# Patient Record
Sex: Female | Born: 1955 | Race: White | Hispanic: No | Marital: Married | Smoking: Former smoker
Health system: Southern US, Community
[De-identification: ages and names within clinical notes are randomized; demographics above are authoritative.]

## PROBLEM LIST (undated history)

## (undated) DIAGNOSIS — K219 Gastro-esophageal reflux disease without esophagitis: Secondary | ICD-10-CM

## (undated) DIAGNOSIS — G4730 Sleep apnea, unspecified: Secondary | ICD-10-CM

## (undated) DIAGNOSIS — K649 Unspecified hemorrhoids: Secondary | ICD-10-CM

## (undated) DIAGNOSIS — E119 Type 2 diabetes mellitus without complications: Secondary | ICD-10-CM

## (undated) DIAGNOSIS — C786 Secondary malignant neoplasm of retroperitoneum and peritoneum: Secondary | ICD-10-CM

## (undated) DIAGNOSIS — F329 Major depressive disorder, single episode, unspecified: Secondary | ICD-10-CM

## (undated) DIAGNOSIS — Z9889 Other specified postprocedural states: Secondary | ICD-10-CM

## (undated) DIAGNOSIS — C801 Malignant (primary) neoplasm, unspecified: Secondary | ICD-10-CM

## (undated) DIAGNOSIS — R1310 Dysphagia, unspecified: Secondary | ICD-10-CM

## (undated) DIAGNOSIS — R42 Dizziness and giddiness: Secondary | ICD-10-CM

## (undated) DIAGNOSIS — G709 Myoneural disorder, unspecified: Secondary | ICD-10-CM

## (undated) DIAGNOSIS — R350 Frequency of micturition: Secondary | ICD-10-CM

## (undated) DIAGNOSIS — I10 Essential (primary) hypertension: Secondary | ICD-10-CM

## (undated) DIAGNOSIS — R198 Other specified symptoms and signs involving the digestive system and abdomen: Secondary | ICD-10-CM

## (undated) DIAGNOSIS — F32A Depression, unspecified: Secondary | ICD-10-CM

## (undated) DIAGNOSIS — R112 Nausea with vomiting, unspecified: Secondary | ICD-10-CM

## (undated) DIAGNOSIS — K626 Ulcer of anus and rectum: Secondary | ICD-10-CM

## (undated) DIAGNOSIS — IMO0001 Reserved for inherently not codable concepts without codable children: Secondary | ICD-10-CM

## (undated) DIAGNOSIS — D649 Anemia, unspecified: Secondary | ICD-10-CM

## (undated) HISTORY — PX: FRACTURE SURGERY: SHX138

## (undated) HISTORY — PX: ABDOMINAL HYSTERECTOMY: SHX81

## (undated) HISTORY — PX: HIP FRACTURE SURGERY: SHX118

---

## 1991-08-13 HISTORY — PX: BACK SURGERY: SHX140

## 1994-08-12 HISTORY — PX: OTHER SURGICAL HISTORY: SHX169

## 2000-07-28 ENCOUNTER — Other Ambulatory Visit: Admission: RE | Admit: 2000-07-28 | Discharge: 2000-07-28 | Payer: Self-pay | Admitting: Obstetrics and Gynecology

## 2000-08-19 ENCOUNTER — Inpatient Hospital Stay (HOSPITAL_COMMUNITY): Admission: AD | Admit: 2000-08-19 | Discharge: 2000-08-19 | Payer: Self-pay

## 2000-09-16 ENCOUNTER — Encounter (INDEPENDENT_AMBULATORY_CARE_PROVIDER_SITE_OTHER): Payer: Self-pay | Admitting: Specialist

## 2000-09-16 ENCOUNTER — Inpatient Hospital Stay (HOSPITAL_COMMUNITY): Admission: RE | Admit: 2000-09-16 | Discharge: 2000-09-18 | Payer: Self-pay | Admitting: Obstetrics and Gynecology

## 2001-10-14 ENCOUNTER — Other Ambulatory Visit: Admission: RE | Admit: 2001-10-14 | Discharge: 2001-10-14 | Payer: Self-pay | Admitting: Obstetrics and Gynecology

## 2002-12-17 ENCOUNTER — Other Ambulatory Visit: Admission: RE | Admit: 2002-12-17 | Discharge: 2002-12-17 | Payer: Self-pay | Admitting: Obstetrics and Gynecology

## 2004-01-13 ENCOUNTER — Other Ambulatory Visit: Admission: RE | Admit: 2004-01-13 | Discharge: 2004-01-13 | Payer: Self-pay | Admitting: Obstetrics and Gynecology

## 2005-08-30 ENCOUNTER — Other Ambulatory Visit: Payer: Self-pay

## 2005-09-04 ENCOUNTER — Ambulatory Visit: Payer: Self-pay | Admitting: Otolaryngology

## 2009-09-11 ENCOUNTER — Encounter (HOSPITAL_BASED_OUTPATIENT_CLINIC_OR_DEPARTMENT_OTHER): Admission: RE | Admit: 2009-09-11 | Discharge: 2009-10-17 | Payer: Self-pay | Admitting: Internal Medicine

## 2009-12-04 ENCOUNTER — Emergency Department: Payer: Self-pay | Admitting: Emergency Medicine

## 2010-12-28 NOTE — Op Note (Signed)
[facility]  Patient:    JENCY, SCHNIEDERS                       MRN: [id number] Proc. Date: 09/16/00 Adm. Date:  [id number] Attending:  Marcelle Overlie                           Operative Report  PREOPERATIVE DIAGNOSES:       1. Menometrorrhagia.                               2. Uterine fibroids.                               3. Pelvic pain.  POSTOPERATIVE DIAGNOSES:      1. Menometrorrhagia.                               2. Uterine fibroids.                               3. Pelvic pain.  PROCEDURE:                    Attempted vaginal hysterectomy with conversion to exploratory laparotomy and total abdominal hysterectomy with lysis of adhesions.  SURGEON:                      Marcelle Overlie, M.D.  ASSISTANT:                    Caralyn Guile. Arlyce Dice, M.D.  ANESTHESIA:                   General.  ESTIMATED BLOOD LOSS:         250.  DESCRIPTION OF PROCEDURE:     The patient was taken to the operating room, where she was intubated.  She was then placed in the low lithotomy position and the vagina and vulva were prepped and draped in the usual sterile fashion, A speculum was inserted in the vagina.  The cervix was identified and grasped with a tenaculum.  The cervix was then infiltrated paracervically.  A circumferential incision was made using electrocautery.  Overlying vaginal epithelium was then pushed away anteriorly, posteriorly and laterally.  The patient had an extremely deep posterior cul-de-sac, so attention was then turned to the cardinal complex, which was clamped on the left side using a curved Heaney clamp.  The pedicles were then cut and suture ligated using 0 Vicryl.  This was done on the right side in a similar fashion.  We then continued to push the overlying vaginal epithelial tissue away anteriorly, posteriorly and laterally.  The cul-de-sac was then identified posteriorly and entered sharply.  There were no adhesions in the  posterior cul-de-sac. Attention was then turned anteriorly.  Using blunt and sharp dissection, the anterior cul-de-sac was entered.  This did take a great deal of blunt and sharp dissection.  We then clamped the uterosacrals on both sides.  The pedicles were cut and suture ligated using 0 Vicryl suture.  The uterine arteries were then clamped subsequently using curved Heaney clamps on each side.  The pedicles were cut and suture ligated  using 0 Vicryl suture.  As we continued to clamp along the broad ligament, the uterus did not descend well. It felt like there was some resistance to the uterus, and even attempts to retroflex the uterus using tenaculum were unsuccessful.  Because of this, it was felt that there might be some adhesions to the uterine fundus.  A conversion to exploratory laparotomy was decided upon.  The patient was then placed supine on the operating room table.  The abdomen was prepped and draped in the usual sterile fashion.  A Foley catheter was already in the bladder, which was placed at the beginning of the case.  Using a scalpel, a low transverse incision was made and carried down to the fascia using electrocautery.  The fascia was scored in the midline and extended laterally using Mayo scissors.  The midline was then separated.  The peritoneum was entered sharply.  An OConner-OSullivan retractor was placed into the abdominal cavity.  Attention then turned to the pelvis and there were thick omental adhesions to the uterine fundus anteriorly.  These were then freed using sharp and blunt dissection.  At this point, we then proceeded with completing the hysterectomy.  The round ligament was identified on the right side.  It was transected using 0 Vicryl suture and then transected using electrocautery.  The remainder of the bladder flap was then created using sharp dissection.  An avascular window was found beneath the triple pedicle and ______.  A curved Heaney clamp was  placed across this, the pedicles cut and the triple pedicle was secured using a suture ligature using 0 Vicryl suture and a free tie of 0 Vicryl.  On the left side of the pelvis, the round ligament was transected using 0 Vicryl in a similar fashion.  A bladder flap was further developed.  An avascular window was created beneath the triple pedicle and a curved Heaney clamp was placed across the triple pedicle.  The pedicle was cut and suture ligated using 0 Vicryl suture as well as a free tie of 0 Vicryl.  We then realized that the remainder of the hysterectomy had been completed vaginally.  The uterus was removed from the surgical field and sent to pathology.  The ovaries were normal in appearance.  The fallopian tubes were also normal in appearance.  Attention was then turned to the vaginal cuff, which was deep in the pelvis.  It was then grasped using Kocher clamps and Allis clamps and the remainder of the cuff was closed in a running stitch using 0 Vicryl suture.  Irrigation was performed.  All pedicles were inspected and noted to be hemostatic.  The patient tolerated the procedure extremely well.  The retractor was removed from the abdominal cavity.  The peritoneal incision was closed using 0 Vicryl in a continuous running stitch.  The rectus muscles were reapproximated using the same stitch.  The fascia was then closed using 0 Vicryl in a continuous running stitch starting at each corner and meeting in the midline.  The skin was closed with staples.  All sponge, lap and instrument counts were correct x 2.  The patient tolerated the procedure well and went to the recovery room in stable condition.  COMPLICATIONS:                None.  DRAINS:                       Foley.  PATHOLOGY:  Uterus and cervix. DD:  09/16/00 TD:  09/17/00 Job: [id number]

## 2010-12-28 NOTE — Discharge Summary (Signed)
[facility]  Patient:    Beth Kaufman, Beth Kaufman                       MRN: [id number] Adm. Date:  [id number] Disc. Date: [id number] Attending:  Marcelle Overlie                           Discharge Summary  ADMISSION DIAGNOSES:          1. Menometrorrhagia.                               2. Symptomatic fibroids.  DISCHARGE DIAGNOSES:          1. Menometrorrhagia.                               2. Symptomatic fibroids.  PROCEDURES IN HOSPITAL:       1. Total abdominal hysterectomy.                               2. Lysis of adhesions.  HOSPITAL COURSE:              The patient is a 55 year old, gravida 0, with a long standing history of menometrorrhagia. She has been on several different OCs and she received Lupron Depot on August 19, 2000. The patient was then taken to the operating room. A vaginal hysterectomy was attempted; however, we were unable to perform the vaginal hysterectomy secondary to omental adhesions to the fundus. The patient subsequently had a conversion to an exploratory laparotomy and had a TAH and lysis of adhesions without incident. The patient did very well postoperatively. Her hematocrit postoperative day one was 31.1, and by postoperative day #2 she was ambulating, tolerating regular diet, voiding without difficulty, and remained afebrile with stable vital signs. She was discharged home on postoperative day #2.  DISCHARGE MEDICATIONS:        She was given a prescription for Tylenol #3 as well as Tussionex to take p.r.n. cough.  DISCHARGE FOLLOWUP:           She will follow up in the office on Monday, February 11 for staple removal and she was advised to call if she has any nausea, vomiting, abdominal pain, temperature greater than 100.5, or redness or drainage from the incision site. She was advised no driving for two to three weeks.  DISPOSITION:                  She was discharged home in good condition. DD:  10/19/00 TD:   10/19/00 Job: [id number]

## 2012-06-15 ENCOUNTER — Encounter (HOSPITAL_BASED_OUTPATIENT_CLINIC_OR_DEPARTMENT_OTHER): Payer: Self-pay | Attending: General Surgery

## 2012-06-15 DIAGNOSIS — E65 Localized adiposity: Secondary | ICD-10-CM | POA: Insufficient documentation

## 2012-06-15 DIAGNOSIS — Z79899 Other long term (current) drug therapy: Secondary | ICD-10-CM | POA: Insufficient documentation

## 2012-06-15 DIAGNOSIS — L03319 Cellulitis of trunk, unspecified: Secondary | ICD-10-CM | POA: Insufficient documentation

## 2012-06-15 DIAGNOSIS — L02219 Cutaneous abscess of trunk, unspecified: Secondary | ICD-10-CM | POA: Insufficient documentation

## 2012-06-15 DIAGNOSIS — I10 Essential (primary) hypertension: Secondary | ICD-10-CM | POA: Insufficient documentation

## 2012-06-15 LAB — GLUCOSE, CAPILLARY: Glucose-Capillary: 124 mg/dL — ABNORMAL HIGH (ref 70–99)

## 2012-06-15 NOTE — Progress Notes (Signed)
[facility]  NAME:  Beth Kaufman, Beth Kaufman NO.:  [id number]  MEDICAL RECORD NO.:  [id number]      DATE OF BIRTH:  [birth year]  PHYSICIAN:  Ardath Sax, M.D.           VISIT DATE:                                  OFFICE VISIT   This is a 56 year old morbidly obese female who comes to [place] because of bouts of these little punctate, what look like, Staph infections on her buttocks and on her hips.  When I am looking at them now, they are about 3 or 4 mm in diameter.  There is only about 3 of them and it looks like they drained and they have been treated with antibiotics by her family doctor.  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  I also gave her some advice that it might be wise to see a plastic surgeon to have a panniculectomy as I do not see how this ever going to get better as long as she has got this huge panniculus hanging down.  She also has diabetes and hypertension.  She is afebrile at the time of her examination.  She has a blood pressure of 154/89, respirations 16, pulse 101.  She weighs a little over 250 pounds.  Her medicines include lisinopril for hypertension.  She also is on glipizide for her diabetes and she states she also had back surgery for ruptured disk.  She had a fractured ankle that was surgically repaired.  She also had a hysterectomy.  She was treated today with the Silvercel and I told her to use the powder and to call and come back if she ever needed [place] and to follow up with the plastic surgeon.  So her diagnosis is obesity, type 2 diabetes, hypertension, cellulitis of the area in between the layers of skin from her large abdominal panniculus and now these little areas that look like superficial staph infections  that have been treated successfully with antibiotics and is now healed over.     Ardath Sax, M.D.     PP/MEDQ  D:  06/15/2012  T:  06/15/2012  Job:  [id number]

## 2012-07-13 ENCOUNTER — Encounter (HOSPITAL_BASED_OUTPATIENT_CLINIC_OR_DEPARTMENT_OTHER): Payer: Self-pay

## 2013-02-23 ENCOUNTER — Ambulatory Visit: Payer: Self-pay | Admitting: Internal Medicine

## 2013-12-01 ENCOUNTER — Ambulatory Visit: Payer: Self-pay | Admitting: Podiatry

## 2013-12-29 ENCOUNTER — Ambulatory Visit: Payer: Self-pay | Admitting: Podiatry

## 2014-01-31 ENCOUNTER — Ambulatory Visit: Payer: Self-pay | Admitting: Podiatry

## 2014-02-21 ENCOUNTER — Ambulatory Visit: Payer: Self-pay | Admitting: Podiatry

## 2014-04-07 ENCOUNTER — Emergency Department: Payer: Self-pay | Admitting: Emergency Medicine

## 2014-08-22 ENCOUNTER — Ambulatory Visit: Payer: Self-pay | Admitting: Podiatry

## 2014-08-24 ENCOUNTER — Ambulatory Visit (INDEPENDENT_AMBULATORY_CARE_PROVIDER_SITE_OTHER): Payer: BLUE CROSS/BLUE SHIELD | Admitting: Podiatry

## 2014-08-24 ENCOUNTER — Ambulatory Visit (INDEPENDENT_AMBULATORY_CARE_PROVIDER_SITE_OTHER): Payer: BLUE CROSS/BLUE SHIELD

## 2014-08-24 ENCOUNTER — Encounter: Payer: Self-pay | Admitting: Podiatry

## 2014-08-24 VITALS — BP 128/69 | HR 78 | Resp 16 | Ht 64.0 in | Wt 229.0 lb

## 2014-08-24 DIAGNOSIS — E119 Type 2 diabetes mellitus without complications: Secondary | ICD-10-CM

## 2014-08-24 DIAGNOSIS — E1142 Type 2 diabetes mellitus with diabetic polyneuropathy: Secondary | ICD-10-CM

## 2014-08-24 DIAGNOSIS — M19079 Primary osteoarthritis, unspecified ankle and foot: Secondary | ICD-10-CM

## 2014-08-24 DIAGNOSIS — Q6650 Congenital pes planus, unspecified foot: Secondary | ICD-10-CM

## 2014-08-24 NOTE — Progress Notes (Signed)
   Subjective:    Patient ID: Beth Kaufman, female    DOB: [birth year], 59 y.o.   MRN: [id number]  HPI Comments: i am flat footed. Both 4th toes on both feet curl in. They do hurt. They have been hurting for 2 yrs. They have remained the same. i swell in both feet a lot. My toes are numb. My feet bother me when im able to walk and stand. i have went to the shoe market and got new shoes. My feet burn.   Foot Pain Associated symptoms include joint swelling.      Review of Systems  Musculoskeletal: Positive for joint swelling.  All other systems reviewed and are negative.      Objective:   Physical Exam: I have reviewed her past medical history medications allergy surgery social history review of systems. Pulses are palpable PT bilateral DP barely palpable bilateral. Capillary fill time to digits one through 5 of the bilateral foot was noted to be immediate. Neurologic sensorium is decreased per Semmes-Weinstein monofilament bilateral toes and forefoot. Deep tendon reflexes are intact bilateral muscle strength was 5 over 5 dorsiflexion plantar flexors and inverters and everters all into the musculature is intact. Orthopedic evaluation does demonstrate pes planus hammertoe deformity mildly to reform his bilateral. She also has adductovarus rotated hammertoe deformities with pain on palpation third interdigital space bilateral. Mild Mulder's click is noted. Cutaneous evaluation of a straight supple well-hydrated cutis no erythema edema cellulitis drainage or odor.        Assessment & Plan:  Assessment: Diabetes mellitus with diabetic peripheral neuropathy as well as possible neuropathy associated with spinal stenosis. Pes planus bilateral.  Plan: She will follow up with neurosurgery. She was also scanned for a set of diabetic shoes.

## 2014-09-28 ENCOUNTER — Ambulatory Visit: Payer: No Typology Code available for payment source | Admitting: Podiatry

## 2014-09-28 ENCOUNTER — Encounter (HOSPITAL_BASED_OUTPATIENT_CLINIC_OR_DEPARTMENT_OTHER): Payer: Self-pay | Attending: Surgery

## 2014-10-03 ENCOUNTER — Ambulatory Visit: Payer: No Typology Code available for payment source | Admitting: Podiatry

## 2014-10-10 ENCOUNTER — Encounter: Payer: Self-pay | Admitting: Podiatry

## 2014-10-10 ENCOUNTER — Ambulatory Visit (INDEPENDENT_AMBULATORY_CARE_PROVIDER_SITE_OTHER): Payer: No Typology Code available for payment source | Admitting: Podiatry

## 2014-10-10 DIAGNOSIS — E119 Type 2 diabetes mellitus without complications: Secondary | ICD-10-CM

## 2014-10-10 DIAGNOSIS — Q6650 Congenital pes planus, unspecified foot: Secondary | ICD-10-CM

## 2014-10-10 DIAGNOSIS — M19079 Primary osteoarthritis, unspecified ankle and foot: Secondary | ICD-10-CM

## 2014-10-10 DIAGNOSIS — E1142 Type 2 diabetes mellitus with diabetic polyneuropathy: Secondary | ICD-10-CM

## 2014-10-10 NOTE — Progress Notes (Signed)
Dispensed diabetic shoes and 3 pairs of insoles. Instructions were reviewed and a copy was given to the patient. Patient to reappointment for regularly scheduled diabetic foot care visits or if experiences any trouble with the diabetic shoes.

## 2014-10-10 NOTE — Patient Instructions (Signed)

## 2014-10-17 ENCOUNTER — Encounter (HOSPITAL_BASED_OUTPATIENT_CLINIC_OR_DEPARTMENT_OTHER): Payer: No Typology Code available for payment source | Attending: Plastic Surgery

## 2014-10-17 DIAGNOSIS — L89312 Pressure ulcer of right buttock, stage 2: Secondary | ICD-10-CM | POA: Insufficient documentation

## 2014-10-17 DIAGNOSIS — Z8614 Personal history of Methicillin resistant Staphylococcus aureus infection: Secondary | ICD-10-CM | POA: Insufficient documentation

## 2014-10-17 DIAGNOSIS — E1140 Type 2 diabetes mellitus with diabetic neuropathy, unspecified: Secondary | ICD-10-CM | POA: Diagnosis present

## 2014-10-17 LAB — GLUCOSE, CAPILLARY: Glucose-Capillary: 150 mg/dL — ABNORMAL HIGH (ref 70–99)

## 2014-10-31 DIAGNOSIS — L89312 Pressure ulcer of right buttock, stage 2: Secondary | ICD-10-CM | POA: Diagnosis not present

## 2014-11-02 ENCOUNTER — Ambulatory Visit
Admit: 2014-11-02 | Disposition: A | Discharge status: Home / Self Care | Payer: Self-pay | Attending: Internal Medicine | Admitting: Internal Medicine

## 2014-11-11 ENCOUNTER — Ambulatory Visit
Admit: 2014-11-11 | Disposition: A | Discharge status: Home / Self Care | Payer: Self-pay | Attending: Internal Medicine | Admitting: Internal Medicine

## 2014-11-14 ENCOUNTER — Encounter (HOSPITAL_BASED_OUTPATIENT_CLINIC_OR_DEPARTMENT_OTHER): Payer: No Typology Code available for payment source | Attending: Plastic Surgery

## 2014-11-14 DIAGNOSIS — G629 Polyneuropathy, unspecified: Secondary | ICD-10-CM | POA: Insufficient documentation

## 2014-11-14 DIAGNOSIS — L89312 Pressure ulcer of right buttock, stage 2: Secondary | ICD-10-CM | POA: Insufficient documentation

## 2014-11-14 DIAGNOSIS — M1990 Unspecified osteoarthritis, unspecified site: Secondary | ICD-10-CM | POA: Insufficient documentation

## 2014-11-14 DIAGNOSIS — E118 Type 2 diabetes mellitus with unspecified complications: Secondary | ICD-10-CM | POA: Insufficient documentation

## 2014-11-14 DIAGNOSIS — I10 Essential (primary) hypertension: Secondary | ICD-10-CM | POA: Insufficient documentation

## 2014-12-12 ENCOUNTER — Encounter (HOSPITAL_BASED_OUTPATIENT_CLINIC_OR_DEPARTMENT_OTHER): Payer: No Typology Code available for payment source

## 2014-12-12 ENCOUNTER — Other Ambulatory Visit: Payer: Self-pay | Admitting: Neurosurgery

## 2014-12-23 ENCOUNTER — Encounter (HOSPITAL_COMMUNITY): Payer: Self-pay

## 2014-12-23 ENCOUNTER — Encounter (HOSPITAL_COMMUNITY)
Admission: RE | Admit: 2014-12-23 | Discharge: 2014-12-23 | Disposition: A | Discharge status: Home / Self Care | Payer: Self-pay | Source: Ambulatory Visit | Attending: Neurosurgery | Admitting: Neurosurgery

## 2014-12-23 HISTORY — DX: Ulcer of anus and rectum: K62.6

## 2014-12-23 HISTORY — DX: Anemia, unspecified: D64.9

## 2014-12-23 HISTORY — DX: Other specified postprocedural states: Z98.890

## 2014-12-23 HISTORY — DX: Dysphagia, unspecified: R13.10

## 2014-12-23 HISTORY — DX: Dizziness and giddiness: R42

## 2014-12-23 HISTORY — DX: Major depressive disorder, single episode, unspecified: F32.9

## 2014-12-23 HISTORY — DX: Myoneural disorder, unspecified: G70.9

## 2014-12-23 HISTORY — DX: Essential (primary) hypertension: I10

## 2014-12-23 HISTORY — DX: Frequency of micturition: R35.0

## 2014-12-23 HISTORY — DX: Unspecified hemorrhoids: K64.9

## 2014-12-23 HISTORY — DX: Type 2 diabetes mellitus without complications: E11.9

## 2014-12-23 HISTORY — DX: Depression, unspecified: F32.A

## 2014-12-23 HISTORY — DX: Other specified postprocedural states: R11.2

## 2014-12-23 HISTORY — DX: Gastro-esophageal reflux disease without esophagitis: K21.9

## 2014-12-23 HISTORY — DX: Sleep apnea, unspecified: G47.30

## 2014-12-23 HISTORY — DX: Reserved for inherently not codable concepts without codable children: IMO0001

## 2014-12-23 HISTORY — DX: Other specified symptoms and signs involving the digestive system and abdomen: R19.8

## 2014-12-23 LAB — CBC
HCT: 33.3 % — ABNORMAL LOW (ref 36.0–46.0)
Hemoglobin: 10 g/dL — ABNORMAL LOW (ref 12.0–15.0)
MCH: 20.3 pg — AB (ref 26.0–34.0)
MCHC: 30 g/dL (ref 30.0–36.0)
MCV: 67.5 fL — ABNORMAL LOW (ref 78.0–100.0)
PLATELETS: 321 10*3/uL (ref 150–400)
RBC: 4.93 MIL/uL (ref 3.87–5.11)
RDW: 16.9 % — ABNORMAL HIGH (ref 11.5–15.5)
WBC: 6.5 10*3/uL (ref 4.0–10.5)

## 2014-12-23 LAB — BASIC METABOLIC PANEL
Anion gap: 12 (ref 5–15)
BUN: 16 mg/dL (ref 6–20)
CHLORIDE: 97 mmol/L — AB (ref 101–111)
CO2: 27 mmol/L (ref 22–32)
Calcium: 10.1 mg/dL (ref 8.9–10.3)
Creatinine, Ser: 1.35 mg/dL — ABNORMAL HIGH (ref 0.44–1.00)
GFR calc Af Amer: 49 mL/min — ABNORMAL LOW (ref >60)
GFR, EST NON AFRICAN AMERICAN: 42 mL/min — AB (ref >60)
GLUCOSE: 160 mg/dL — AB (ref 65–99)
Potassium: 2.7 mmol/L — CL (ref 3.5–5.1)
SODIUM: 136 mmol/L (ref 135–145)

## 2014-12-23 LAB — SURGICAL PCR SCREEN
MRSA, PCR: NEGATIVE
STAPHYLOCOCCUS AUREUS: NEGATIVE

## 2014-12-23 NOTE — Progress Notes (Signed)
Critical lab Potassium 2.7 called to [facility] @ dr. Arnoldo Morale office.

## 2014-12-23 NOTE — Pre-Procedure Instructions (Signed)
AALEAH HIRSCH  12/23/2014   Your procedure is scheduled on:  01-02-2015    Monday   Report to [facility] Admitting at 5:30 AM.   Call this number if you have problems the morning of surgery: [phone]   Remember:   Do not eat food or drink liquids after midnight.    Take these medicines the morning of surgery with A SIP OF WATER: Sertraline(Zoloft)               Stop aspirin,BC Headache powders,ibuprofen,motrin,advil.aleve,naproxen and any any herbal supplements 7 days prior to surgery     Do not wear jewelry, make-up or nail polish.  Do not wear lotions, powders, or perfumes. .  Do not shave legs or underarms for 48 hours prior to surgery    Do not bring valuables to the hospital.  [facility] is not responsible for any belongings or valuables.               Contacts, dentures or bridgework may not be worn into surgery.   Leave suitcase in the car. After surgery it may be brought to your room .  For patients admitted to the hospital, discharge time is determined by your  treatment team.               Patients discharged the day of surgery will not be allowed to drive home.      Special Instructions: See attached sheet for instructions on CHG shower/bath     Please read over the following fact sheets that you were given: Pain Booklet and Surgical Site Infection Prevention

## 2014-12-26 NOTE — Progress Notes (Signed)
Anesthesia Chart Review:  Pt is 59 year old female scheduled for L2-3, L3-4 lumbar laminectomy/decompression microdiscectomy on 01/02/2015 with Dr. Arnoldo Morale.   PCP is Dr. Emily Filbert in [place], see care everywhere.   PMH includes: HTN, OSA, DM, anemia, GERD. Former smoker. BMI 34.   Preoperative labs reviewed.  H/H 10/33.3. This is consistent with prior results in care everywhere, being treated for iron deficiency anemia by PCP.   K 2.7. Pt went same day as PAT to PCP's office for follow up of hypokalemia. See care everywhere. BMP repeated, K 3.3, which is stable when compared to labs on 10/13/14. Pt's hctz 25mg  held for 2 days then to restart at 12.5mg  daily, pt started on KCL 11meq daily, f/u recommended for the following week.   EKG: NSR. Moderate voltage criteria for LVH, may be normal variant. Since last tracing 08/30/2005 T wave abnormality is new (appears nonspecific).   If no changes, I anticipate pt can proceed with surgery as scheduled.   Willeen Cass, FNP-BC [facility]/Anesthesiology Phone: [phone] 12/26/2014 3:10 PM

## 2015-01-01 ENCOUNTER — Emergency Department (HOSPITAL_COMMUNITY)
Admission: EM | Admit: 2015-01-01 | Discharge: 2015-01-01 | Disposition: A | Discharge status: Home / Self Care | Payer: No Typology Code available for payment source | Attending: Emergency Medicine | Admitting: Emergency Medicine

## 2015-01-01 ENCOUNTER — Encounter (HOSPITAL_COMMUNITY): Payer: Self-pay | Admitting: *Deleted

## 2015-01-01 DIAGNOSIS — F329 Major depressive disorder, single episode, unspecified: Secondary | ICD-10-CM | POA: Insufficient documentation

## 2015-01-01 DIAGNOSIS — E119 Type 2 diabetes mellitus without complications: Secondary | ICD-10-CM | POA: Insufficient documentation

## 2015-01-01 DIAGNOSIS — G4730 Sleep apnea, unspecified: Secondary | ICD-10-CM | POA: Insufficient documentation

## 2015-01-01 DIAGNOSIS — F13939 Sedative, hypnotic or anxiolytic use, unspecified with withdrawal, unspecified: Secondary | ICD-10-CM

## 2015-01-01 DIAGNOSIS — R112 Nausea with vomiting, unspecified: Secondary | ICD-10-CM | POA: Insufficient documentation

## 2015-01-01 DIAGNOSIS — Z8719 Personal history of other diseases of the digestive system: Secondary | ICD-10-CM | POA: Insufficient documentation

## 2015-01-01 DIAGNOSIS — Z862 Personal history of diseases of the blood and blood-forming organs and certain disorders involving the immune mechanism: Secondary | ICD-10-CM | POA: Insufficient documentation

## 2015-01-01 DIAGNOSIS — F13239 Sedative, hypnotic or anxiolytic dependence with withdrawal, unspecified: Secondary | ICD-10-CM | POA: Insufficient documentation

## 2015-01-01 DIAGNOSIS — E860 Dehydration: Secondary | ICD-10-CM | POA: Insufficient documentation

## 2015-01-01 DIAGNOSIS — I10 Essential (primary) hypertension: Secondary | ICD-10-CM | POA: Insufficient documentation

## 2015-01-01 DIAGNOSIS — Z79899 Other long term (current) drug therapy: Secondary | ICD-10-CM | POA: Insufficient documentation

## 2015-01-01 DIAGNOSIS — Z87891 Personal history of nicotine dependence: Secondary | ICD-10-CM | POA: Insufficient documentation

## 2015-01-01 LAB — URINALYSIS, ROUTINE W REFLEX MICROSCOPIC
Bilirubin Urine: NEGATIVE
Glucose, UA: NEGATIVE mg/dL
Hgb urine dipstick: NEGATIVE
Ketones, ur: NEGATIVE mg/dL
Nitrite: NEGATIVE
Protein, ur: NEGATIVE mg/dL
Specific Gravity, Urine: 1.017 (ref 1.005–1.030)
Urobilinogen, UA: 0.2 mg/dL (ref 0.0–1.0)
pH: 6 (ref 5.0–8.0)

## 2015-01-01 LAB — URINE MICROSCOPIC-ADD ON

## 2015-01-01 LAB — CBC WITH DIFFERENTIAL/PLATELET
Basophils Absolute: 0.1 10*3/uL (ref 0.0–0.1)
Basophils Relative: 1 % (ref 0–1)
Eosinophils Absolute: 0.2 10*3/uL (ref 0.0–0.7)
Eosinophils Relative: 3 % (ref 0–5)
HCT: 35.6 % — ABNORMAL LOW (ref 36.0–46.0)
Hemoglobin: 11 g/dL — ABNORMAL LOW (ref 12.0–15.0)
Lymphocytes Relative: 21 % (ref 12–46)
Lymphs Abs: 1.4 10*3/uL (ref 0.7–4.0)
MCH: 20.5 pg — ABNORMAL LOW (ref 26.0–34.0)
MCHC: 30.9 g/dL (ref 30.0–36.0)
MCV: 66.4 fL — ABNORMAL LOW (ref 78.0–100.0)
Monocytes Absolute: 0.5 10*3/uL (ref 0.1–1.0)
Monocytes Relative: 7 % (ref 3–12)
Neutro Abs: 4.3 10*3/uL (ref 1.7–7.7)
Neutrophils Relative %: 68 % (ref 43–77)
Platelets: 321 10*3/uL (ref 150–400)
RBC: 5.36 MIL/uL — ABNORMAL HIGH (ref 3.87–5.11)
RDW: 17.3 % — ABNORMAL HIGH (ref 11.5–15.5)
WBC: 6.5 10*3/uL (ref 4.0–10.5)

## 2015-01-01 LAB — COMPREHENSIVE METABOLIC PANEL
ALT: 9 U/L — ABNORMAL LOW (ref 14–54)
AST: 16 U/L (ref 15–41)
Albumin: 3.9 g/dL (ref 3.5–5.0)
Alkaline Phosphatase: 80 U/L (ref 38–126)
Anion gap: 12 (ref 5–15)
BUN: 17 mg/dL (ref 6–20)
CO2: 19 mmol/L — ABNORMAL LOW (ref 22–32)
Calcium: 10.7 mg/dL — ABNORMAL HIGH (ref 8.9–10.3)
Chloride: 107 mmol/L (ref 101–111)
Creatinine, Ser: 1.24 mg/dL — ABNORMAL HIGH (ref 0.44–1.00)
GFR calc Af Amer: 54 mL/min — ABNORMAL LOW (ref >60)
GFR calc non Af Amer: 47 mL/min — ABNORMAL LOW (ref >60)
Glucose, Bld: 143 mg/dL — ABNORMAL HIGH (ref 65–99)
Potassium: 3.3 mmol/L — ABNORMAL LOW (ref 3.5–5.1)
Sodium: 138 mmol/L (ref 135–145)
Total Bilirubin: 0.8 mg/dL (ref 0.3–1.2)
Total Protein: 7.4 g/dL (ref 6.5–8.1)

## 2015-01-01 LAB — I-STAT CG4 LACTIC ACID, ED: Lactic Acid, Venous: 1 mmol/L (ref 0.5–2.0)

## 2015-01-01 MED ORDER — LORAZEPAM 2 MG/ML IJ SOLN
1.0000 mg | Freq: Once | INTRAMUSCULAR | Status: AC
  Administered 2015-01-01: 1 mg via INTRAVENOUS
  Filled 2015-01-01: qty 1

## 2015-01-01 MED ORDER — LORAZEPAM 0.5 MG PO TABS: 0.5000 mg | ORAL_TABLET | Freq: Every day | ORAL | Status: DC

## 2015-01-01 MED ORDER — SODIUM CHLORIDE 0.9 % IV BOLUS (SEPSIS)
1000.0000 mL | Freq: Once | INTRAVENOUS | Status: AC
  Administered 2015-01-01: 1000 mL via INTRAVENOUS

## 2015-01-01 MED ORDER — ONDANSETRON HCL 4 MG/2ML IJ SOLN
4.0000 mg | Freq: Once | INTRAMUSCULAR | Status: AC
Start: 2015-01-01 — End: 2015-01-01
  Administered 2015-01-01: 4 mg via INTRAVENOUS
  Filled 2015-01-01: qty 2

## 2015-01-01 MED ORDER — PROMETHAZINE HCL 25 MG/ML IJ SOLN
25.0000 mg | Freq: Once | INTRAMUSCULAR | Status: AC
  Administered 2015-01-01: 25 mg via INTRAVENOUS
  Filled 2015-01-01: qty 1

## 2015-01-01 NOTE — ED Notes (Signed)
Pt given saltine crackers and ginger ale per request. 

## 2015-01-01 NOTE — ED Notes (Addendum)
Pt c/o nausea; no emesis.

## 2015-01-01 NOTE — ED Notes (Signed)
Pt reports fatigue and Nausea/Dirrhea x 1 week. Pt had been taking tramadol for back pain and xanax x 1 years; was recently taken off of medications and changed to valium due to upcoming back surgery on 5/23. Husband at bedside reports poor intake and weakness at home. Pt had pre-op labs last week, showing potassium 2.7. Husband reports pt has had potassium rechecked, showing potassium 3.5. Pt has been taking potassium tablets at home.

## 2015-01-01 NOTE — Discharge Instructions (Signed)
Return here as needed.  Follow-up with your regular doctor.  Slowly increase her fluid intake

## 2015-01-01 NOTE — ED Notes (Signed)
Pt to ED via POV c/o fatigue, nausea, dry heaving, and decreased appetite x 1 week. Pt went to see PCP, Dr.Miller, last week for questionable panic attacks. Pt was told to stop xanax and tramadol, was prescribed valium. Pt has been having bouts of nausea, unable to vomit since stopping medications. Pt is scheduled for back surgery tomorrow, 5/23.

## 2015-01-01 NOTE — ED Provider Notes (Signed)
CSN: [id number]     Arrival date & time 01/01/15  1030 History   First MD Initiated Contact with Patient 01/01/15 1045     Chief Complaint  Patient presents with  . Fatigue  . Nausea  . Emesis     (Consider location/radiation/quality/duration/timing/severity/associated sxs/prior Treatment) HPI Patient presents to the emergency department with nausea and diarrhea over the past week.  The patient was taken off of her tramadol and Xanax that she has been on for over a year.  Patient states that she was placed on Valium by her primary care doctor.  She states this did not seem to help.  The patient has also had decreased oral intake.  She is also complaining of generalized weakness.  Patient's potassium was 2.7 last week she has been taking oral potassium.  Her potassium was rechecked and showed it was 3.5.  Patient states she has not had any chest pain, shortness of breath, fever, dizziness, abdominal pain, neck pain, blurred vision, headache, cough, runny nose, sore throat, or syncope.  The patient states that she has not been eating and drinking normally due to her nausea. Past Medical History  Diagnosis Date  . Sleep apnea     BiPAP  . Hypertension   . Shortness of breath dyspnea     with exertion  . Urinary frequency   . Anemia   . Ulcer of anus     treated at wound center  . Neuromuscular disorder     peripheral neuropathy  . GERD (gastroesophageal reflux disease)   . Hemorrhoids   . Diabetes mellitus without complication   . Depression   . PONV (postoperative nausea and vomiting)   . Dizziness   . Difficulty swallowing pills    Past Surgical History  Procedure Laterality Date  . Abdominal hysterectomy    . Back surgery  1993  . Hip fracture surgery Left     partial replacement  . Fracture surgery Right     broken ankle  . Broken ankle Right 1996    screws and plates   History reviewed. No pertinent family history. History  Substance Use Topics  . Smoking status:  Former Smoker -- 1.00 packs/day for 20 years    Types: Cigarettes    Quit date: 08/12/1994  . Smokeless tobacco: Not on file  . Alcohol Use: No     Comment: rarely   OB History    No data available     Review of Systems  All other systems negative except as documented in the HPI. All pertinent positives and negatives as reviewed in the HPI.  Allergies  Morphine and Terbinafine  Home Medications   Prior to Admission medications   Medication Sig Start Date End Date Taking? Authorizing Provider  bismuth subsalicylate (PEPTO BISMOL) 262 MG/15ML suspension Take 30 mLs by mouth every 6 (six) hours as needed for indigestion.   Yes Historical Provider, MD  diazepam (VALIUM) 5 MG tablet Take 5 mg by mouth every 12 (twelve) hours as needed for anxiety.   Yes Historical Provider, MD  glipiZIDE (GLUCOTROL) 10 MG tablet Take 10 mg by mouth 2 (two) times daily before a meal.    Yes Historical Provider, MD  potassium chloride SA (K-DUR,KLOR-CON) 20 MEQ tablet Take 20 mEq by mouth 2 (two) times daily.  12/23/14 12/23/15 Yes Historical Provider, MD  Sennosides-Docusate Sodium (SENNA PLUS PO) Take 1 tablet by mouth daily as needed.   Yes Historical Provider, MD  sertraline (ZOLOFT) 100 MG tablet  TAKE ONE TABLET BY MOUTH EVERY MORNING 02/14/14  Yes Historical Provider, MD  Simethicone (MYLANTA GAS PO) Take 2 tablets by mouth daily as needed.   Yes Historical Provider, MD  temazepam (RESTORIL) 30 MG capsule Take 30 mg by mouth at bedtime as needed for sleep.    Yes Historical Provider, MD  triamterene-hydrochlorothiazide (DYAZIDE) 37.5-25 MG per capsule Take 1 capsule by mouth daily.   Yes Historical Provider, MD   BP 138/86 mmHg  Pulse 88  Temp(Src) 99.3 F (37.4 C) (Rectal)  Resp 16  Ht 5\' 4"  (1.626 m)  Wt 195 lb (88.451 kg)  BMI 33.46 kg/m2  SpO2 100% Physical Exam  Constitutional: She is oriented to person, place, and time. She appears well-developed and well-nourished. No distress.  HENT:   Head: Normocephalic and atraumatic.  Mouth/Throat: Oropharynx is clear and moist.  Eyes: Pupils are equal, round, and reactive to light.  Neck: Normal range of motion. Neck supple.  Cardiovascular: Normal rate, regular rhythm and normal heart sounds.  Exam reveals no gallop and no friction rub.   No murmur heard. Pulmonary/Chest: Effort normal and breath sounds normal. No respiratory distress.  Abdominal: Soft. Bowel sounds are normal. She exhibits no distension. There is no tenderness.  Musculoskeletal: She exhibits no edema.  Neurological: She is alert and oriented to person, place, and time. She exhibits normal muscle tone. Coordination normal.  Skin: Skin is warm and dry. No rash noted. No erythema.  Nursing note and vitals reviewed.   ED Course  Procedures (including critical care time) Labs Review Labs Reviewed  CBC WITH DIFFERENTIAL/PLATELET - Abnormal; Notable for the following:    RBC 5.36 (*)    Hemoglobin 11.0 (*)    HCT 35.6 (*)    MCV 66.4 (*)    MCH 20.5 (*)    RDW 17.3 (*)    All other components within normal limits  URINALYSIS, ROUTINE W REFLEX MICROSCOPIC  COMPREHENSIVE METABOLIC PANEL  I-STAT CG4 LACTIC ACID, ED    The patient is given IV fluids here in the emergency department with antiemetics.  She states she is feeling much better.  She is tolerated oral intake long with saltines and applesauce.  The patient appears to be much more comfortable and appears more hydrated at this time.  I advised her to follow-up with her primary care doctor.  She is due to have surgery in the morning and I advised her that she wanted still need to come to the hospital for this     Dalia Heading, PA-C 01/01/15 [place], MD 01/01/15 1535

## 2015-01-01 NOTE — ED Notes (Signed)
Pt tolerated crackers and ginger ale; requesting applesauce which has been provided.

## 2015-01-02 ENCOUNTER — Encounter (HOSPITAL_COMMUNITY)
Admission: RE | Disposition: A | Discharge status: Home / Self Care | Payer: Self-pay | Source: Ambulatory Visit | Attending: Neurosurgery

## 2015-01-02 ENCOUNTER — Ambulatory Visit (HOSPITAL_COMMUNITY): Payer: Self-pay | Admitting: Anesthesiology

## 2015-01-02 ENCOUNTER — Inpatient Hospital Stay (HOSPITAL_COMMUNITY)
Admission: RE | Admit: 2015-01-02 | Discharge: 2015-01-03 | DRG: 519 | Disposition: A | Discharge status: Home / Self Care | Payer: Self-pay | Source: Ambulatory Visit | Attending: Neurosurgery | Admitting: Neurosurgery

## 2015-01-02 ENCOUNTER — Ambulatory Visit (HOSPITAL_COMMUNITY): Payer: Self-pay | Admitting: Emergency Medicine

## 2015-01-02 ENCOUNTER — Ambulatory Visit (HOSPITAL_COMMUNITY): Payer: No Typology Code available for payment source

## 2015-01-02 ENCOUNTER — Encounter (HOSPITAL_COMMUNITY): Payer: Self-pay | Admitting: Anesthesiology

## 2015-01-02 DIAGNOSIS — Z90710 Acquired absence of both cervix and uterus: Secondary | ICD-10-CM

## 2015-01-02 DIAGNOSIS — M5416 Radiculopathy, lumbar region: Secondary | ICD-10-CM | POA: Diagnosis present

## 2015-01-02 DIAGNOSIS — Z79899 Other long term (current) drug therapy: Secondary | ICD-10-CM

## 2015-01-02 DIAGNOSIS — Z87891 Personal history of nicotine dependence: Secondary | ICD-10-CM

## 2015-01-02 DIAGNOSIS — M4806 Spinal stenosis, lumbar region: Principal | ICD-10-CM | POA: Diagnosis present

## 2015-01-02 DIAGNOSIS — Z96642 Presence of left artificial hip joint: Secondary | ICD-10-CM | POA: Diagnosis present

## 2015-01-02 DIAGNOSIS — G4730 Sleep apnea, unspecified: Secondary | ICD-10-CM | POA: Diagnosis present

## 2015-01-02 DIAGNOSIS — R0609 Other forms of dyspnea: Secondary | ICD-10-CM | POA: Diagnosis present

## 2015-01-02 DIAGNOSIS — M48062 Spinal stenosis, lumbar region with neurogenic claudication: Secondary | ICD-10-CM | POA: Diagnosis present

## 2015-01-02 DIAGNOSIS — M4185 Other forms of scoliosis, thoracolumbar region: Secondary | ICD-10-CM | POA: Diagnosis present

## 2015-01-02 DIAGNOSIS — Z888 Allergy status to other drugs, medicaments and biological substances status: Secondary | ICD-10-CM

## 2015-01-02 DIAGNOSIS — F329 Major depressive disorder, single episode, unspecified: Secondary | ICD-10-CM | POA: Diagnosis present

## 2015-01-02 DIAGNOSIS — M48061 Spinal stenosis, lumbar region without neurogenic claudication: Secondary | ICD-10-CM

## 2015-01-02 DIAGNOSIS — D509 Iron deficiency anemia, unspecified: Secondary | ICD-10-CM | POA: Diagnosis present

## 2015-01-02 DIAGNOSIS — M5106 Intervertebral disc disorders with myelopathy, lumbar region: Secondary | ICD-10-CM | POA: Diagnosis present

## 2015-01-02 DIAGNOSIS — Z885 Allergy status to narcotic agent status: Secondary | ICD-10-CM

## 2015-01-02 DIAGNOSIS — I10 Essential (primary) hypertension: Secondary | ICD-10-CM | POA: Diagnosis present

## 2015-01-02 DIAGNOSIS — K219 Gastro-esophageal reflux disease without esophagitis: Secondary | ICD-10-CM | POA: Diagnosis present

## 2015-01-02 DIAGNOSIS — R350 Frequency of micturition: Secondary | ICD-10-CM | POA: Diagnosis present

## 2015-01-02 DIAGNOSIS — E1140 Type 2 diabetes mellitus with diabetic neuropathy, unspecified: Secondary | ICD-10-CM | POA: Diagnosis present

## 2015-01-02 HISTORY — PX: LUMBAR LAMINECTOMY/DECOMPRESSION MICRODISCECTOMY: SHX5026

## 2015-01-02 LAB — GLUCOSE, CAPILLARY
GLUCOSE-CAPILLARY: 66 mg/dL (ref 65–99)
Glucose-Capillary: 112 mg/dL — ABNORMAL HIGH (ref 65–99)
Glucose-Capillary: 186 mg/dL — ABNORMAL HIGH (ref 65–99)
Glucose-Capillary: 94 mg/dL (ref 65–99)
Glucose-Capillary: 85 mg/dL (ref 65–99)

## 2015-01-02 LAB — PROTIME-INR
INR: 1.24 (ref 0.00–1.49)
Prothrombin Time: 15.8 seconds — ABNORMAL HIGH (ref 11.6–15.2)

## 2015-01-02 LAB — APTT: aPTT: 20 seconds — ABNORMAL LOW (ref 24–37)

## 2015-01-02 SURGERY — LUMBAR LAMINECTOMY/DECOMPRESSION MICRODISCECTOMY 2 LEVELS
Anesthesia: General | Site: Back

## 2015-01-02 MED ORDER — GLIPIZIDE 10 MG PO TABS
10.0000 mg | ORAL_TABLET | Freq: Two times a day (BID) | ORAL | Status: DC
  Administered 2015-01-02 – 2015-01-03 (×2): 10 mg via ORAL
  Filled 2015-01-02 (×4): qty 1

## 2015-01-02 MED ORDER — TRIAMTERENE-HCTZ 37.5-25 MG PO CAPS
1.0000 | ORAL_CAPSULE | Freq: Every day | ORAL | Status: DC
  Administered 2015-01-02: 1 via ORAL
  Filled 2015-01-02 (×2): qty 1

## 2015-01-02 MED ORDER — PROPOFOL 10 MG/ML IV BOLUS
INTRAVENOUS | Status: AC
  Filled 2015-01-02: qty 20

## 2015-01-02 MED ORDER — ARTIFICIAL TEARS OP OINT
TOPICAL_OINTMENT | OPHTHALMIC | Status: DC | PRN
  Administered 2015-01-02: 1 via OPHTHALMIC

## 2015-01-02 MED ORDER — POTASSIUM CHLORIDE CRYS ER 20 MEQ PO TBCR
20.0000 meq | EXTENDED_RELEASE_TABLET | Freq: Two times a day (BID) | ORAL | Status: DC
  Administered 2015-01-02 (×2): 20 meq via ORAL
  Filled 2015-01-02 (×4): qty 1

## 2015-01-02 MED ORDER — HYDROCODONE-ACETAMINOPHEN 5-325 MG PO TABS: 1.0000 | ORAL_TABLET | ORAL | Status: DC | PRN

## 2015-01-02 MED ORDER — ARTIFICIAL TEARS OP OINT
TOPICAL_OINTMENT | OPHTHALMIC | Status: AC
  Filled 2015-01-02: qty 3.5

## 2015-01-02 MED ORDER — DIAZEPAM 5 MG PO TABS
5.0000 mg | ORAL_TABLET | Freq: Four times a day (QID) | ORAL | Status: DC | PRN
  Administered 2015-01-02: 5 mg via ORAL
  Filled 2015-01-02: qty 1

## 2015-01-02 MED ORDER — EPHEDRINE SULFATE 50 MG/ML IJ SOLN
INTRAMUSCULAR | Status: AC
  Filled 2015-01-02: qty 1

## 2015-01-02 MED ORDER — TEMAZEPAM 15 MG PO CAPS: 30.0000 mg | ORAL_CAPSULE | Freq: Every evening | ORAL | Status: DC | PRN

## 2015-01-02 MED ORDER — DEXAMETHASONE SODIUM PHOSPHATE 4 MG/ML IJ SOLN
INTRAMUSCULAR | Status: DC | PRN
Start: 2015-01-02 — End: 2015-01-02
  Administered 2015-01-02: 8 mg via INTRAVENOUS

## 2015-01-02 MED ORDER — SODIUM CHLORIDE 0.9 % IR SOLN
Status: DC | PRN
  Administered 2015-01-02: 500 mL

## 2015-01-02 MED ORDER — 0.9 % SODIUM CHLORIDE (POUR BTL) OPTIME
TOPICAL | Status: DC | PRN
  Administered 2015-01-02: 1000 mL

## 2015-01-02 MED ORDER — LIDOCAINE HCL (CARDIAC) 20 MG/ML IV SOLN
INTRAVENOUS | Status: DC | PRN
  Administered 2015-01-02: 100 mg via INTRAVENOUS

## 2015-01-02 MED ORDER — ALUM & MAG HYDROXIDE-SIMETH 200-200-20 MG/5ML PO SUSP: 30.0000 mL | Freq: Four times a day (QID) | ORAL | Status: DC | PRN

## 2015-01-02 MED ORDER — MIDAZOLAM HCL 5 MG/5ML IJ SOLN
INTRAMUSCULAR | Status: DC | PRN
  Administered 2015-01-02 (×2): 0.5 mg via INTRAVENOUS
  Administered 2015-01-02: 1 mg via INTRAVENOUS

## 2015-01-02 MED ORDER — PROMETHAZINE HCL 25 MG/ML IJ SOLN: 6.2500 mg | INTRAMUSCULAR | Status: DC | PRN

## 2015-01-02 MED ORDER — CEFAZOLIN SODIUM-DEXTROSE 2-3 GM-% IV SOLR
2.0000 g | Freq: Three times a day (TID) | INTRAVENOUS | Status: AC
  Administered 2015-01-02 (×2): 2 g via INTRAVENOUS
  Filled 2015-01-02 (×2): qty 50

## 2015-01-02 MED ORDER — NEOSTIGMINE METHYLSULFATE 10 MG/10ML IV SOLN
INTRAVENOUS | Status: DC | PRN
  Administered 2015-01-02: 4 mg via INTRAVENOUS

## 2015-01-02 MED ORDER — OXYCODONE-ACETAMINOPHEN 5-325 MG PO TABS
1.0000 | ORAL_TABLET | ORAL | Status: DC | PRN
  Administered 2015-01-02 – 2015-01-03 (×4): 2 via ORAL
  Filled 2015-01-02 (×4): qty 2

## 2015-01-02 MED ORDER — ROCURONIUM BROMIDE 100 MG/10ML IV SOLN
INTRAVENOUS | Status: DC | PRN
  Administered 2015-01-02: 10 mg via INTRAVENOUS
  Administered 2015-01-02: 30 mg via INTRAVENOUS

## 2015-01-02 MED ORDER — HYDROMORPHONE HCL 1 MG/ML IJ SOLN: 1.0000 mg | INTRAMUSCULAR | Status: DC | PRN

## 2015-01-02 MED ORDER — ONDANSETRON HCL 4 MG/2ML IJ SOLN
4.0000 mg | INTRAMUSCULAR | Status: DC | PRN
  Administered 2015-01-02 (×2): 4 mg via INTRAVENOUS
  Filled 2015-01-02 (×2): qty 2

## 2015-01-02 MED ORDER — THROMBIN 5000 UNITS EX SOLR
CUTANEOUS | Status: DC | PRN
  Administered 2015-01-02 (×2): 5000 [IU] via TOPICAL

## 2015-01-02 MED ORDER — SUCCINYLCHOLINE CHLORIDE 20 MG/ML IJ SOLN
INTRAMUSCULAR | Status: AC
  Filled 2015-01-02: qty 1

## 2015-01-02 MED ORDER — SERTRALINE HCL 25 MG PO TABS
25.0000 mg | ORAL_TABLET | Freq: Every day | ORAL | Status: DC
  Administered 2015-01-02: 25 mg via ORAL
  Filled 2015-01-02 (×2): qty 1

## 2015-01-02 MED ORDER — SUCCINYLCHOLINE CHLORIDE 20 MG/ML IJ SOLN
INTRAMUSCULAR | Status: DC | PRN
  Administered 2015-01-02: 120 mg via INTRAVENOUS

## 2015-01-02 MED ORDER — BISACODYL 10 MG RE SUPP: 10.0000 mg | Freq: Every day | RECTAL | Status: DC | PRN

## 2015-01-02 MED ORDER — PROPOFOL 10 MG/ML IV BOLUS
INTRAVENOUS | Status: DC | PRN
  Administered 2015-01-02: 50 mg via INTRAVENOUS
  Administered 2015-01-02: 150 mg via INTRAVENOUS

## 2015-01-02 MED ORDER — STERILE WATER FOR INJECTION IJ SOLN
INTRAMUSCULAR | Status: AC
  Filled 2015-01-02: qty 10

## 2015-01-02 MED ORDER — DOCUSATE SODIUM 100 MG PO CAPS: 100.0000 mg | ORAL_CAPSULE | Freq: Two times a day (BID) | ORAL | Status: DC

## 2015-01-02 MED ORDER — CEFAZOLIN SODIUM-DEXTROSE 2-3 GM-% IV SOLR
2.0000 g | INTRAVENOUS | Status: AC
  Administered 2015-01-02: 2 g via INTRAVENOUS

## 2015-01-02 MED ORDER — SCOPOLAMINE 1 MG/3DAYS TD PT72
MEDICATED_PATCH | TRANSDERMAL | Status: DC | PRN
  Administered 2015-01-02: 1 via TRANSDERMAL

## 2015-01-02 MED ORDER — BUPIVACAINE LIPOSOME 1.3 % IJ SUSP
20.0000 mL | Freq: Once | INTRAMUSCULAR | Status: DC
  Filled 2015-01-02: qty 20

## 2015-01-02 MED ORDER — NEOSTIGMINE METHYLSULFATE 10 MG/10ML IV SOLN
INTRAVENOUS | Status: AC
  Filled 2015-01-02: qty 1

## 2015-01-02 MED ORDER — PHENOL 1.4 % MT LIQD: 1.0000 | OROMUCOSAL | Status: DC | PRN

## 2015-01-02 MED ORDER — HEMOSTATIC AGENTS (NO CHARGE) OPTIME
TOPICAL | Status: DC | PRN
  Administered 2015-01-02: 1 via TOPICAL

## 2015-01-02 MED ORDER — LACTATED RINGERS IV SOLN
INTRAVENOUS | Status: DC | PRN
  Administered 2015-01-02 (×2): via INTRAVENOUS

## 2015-01-02 MED ORDER — FENTANYL CITRATE (PF) 100 MCG/2ML IJ SOLN
INTRAMUSCULAR | Status: DC | PRN
  Administered 2015-01-02: 50 ug via INTRAVENOUS
  Administered 2015-01-02: 100 ug via INTRAVENOUS
  Administered 2015-01-02 (×2): 50 ug via INTRAVENOUS

## 2015-01-02 MED ORDER — ACETAMINOPHEN 650 MG RE SUPP: 650.0000 mg | RECTAL | Status: DC | PRN

## 2015-01-02 MED ORDER — CEFAZOLIN SODIUM-DEXTROSE 2-3 GM-% IV SOLR
INTRAVENOUS | Status: AC
  Filled 2015-01-02: qty 50

## 2015-01-02 MED ORDER — ONDANSETRON HCL 4 MG/2ML IJ SOLN
INTRAMUSCULAR | Status: AC
  Filled 2015-01-02: qty 4

## 2015-01-02 MED ORDER — MIDAZOLAM HCL 2 MG/2ML IJ SOLN
INTRAMUSCULAR | Status: AC
  Filled 2015-01-02: qty 2

## 2015-01-02 MED ORDER — ROCURONIUM BROMIDE 50 MG/5ML IV SOLN
INTRAVENOUS | Status: AC
  Filled 2015-01-02: qty 1

## 2015-01-02 MED ORDER — BUPIVACAINE LIPOSOME 1.3 % IJ SUSP
INTRAMUSCULAR | Status: DC | PRN
  Administered 2015-01-02: 20 mL

## 2015-01-02 MED ORDER — DEXAMETHASONE SODIUM PHOSPHATE 4 MG/ML IJ SOLN
INTRAMUSCULAR | Status: AC
  Filled 2015-01-02: qty 2

## 2015-01-02 MED ORDER — FENTANYL CITRATE (PF) 250 MCG/5ML IJ SOLN
INTRAMUSCULAR | Status: AC
  Filled 2015-01-02: qty 5

## 2015-01-02 MED ORDER — LACTATED RINGERS IV SOLN: INTRAVENOUS | Status: DC

## 2015-01-02 MED ORDER — BUPIVACAINE-EPINEPHRINE (PF) 0.5% -1:200000 IJ SOLN
INTRAMUSCULAR | Status: DC | PRN
  Administered 2015-01-02: 10 mL via PERINEURAL

## 2015-01-02 MED ORDER — MENTHOL 3 MG MT LOZG: 1.0000 | LOZENGE | OROMUCOSAL | Status: DC | PRN

## 2015-01-02 MED ORDER — GLYCOPYRROLATE 0.2 MG/ML IJ SOLN
INTRAMUSCULAR | Status: DC | PRN
  Administered 2015-01-02: 0.6 mg via INTRAVENOUS

## 2015-01-02 MED ORDER — BACITRACIN ZINC 500 UNIT/GM EX OINT
TOPICAL_OINTMENT | CUTANEOUS | Status: DC | PRN
  Administered 2015-01-02: 1 via TOPICAL

## 2015-01-02 MED ORDER — LIDOCAINE HCL (CARDIAC) 20 MG/ML IV SOLN
INTRAVENOUS | Status: AC
  Filled 2015-01-02: qty 5

## 2015-01-02 MED ORDER — ACETAMINOPHEN 325 MG PO TABS: 650.0000 mg | ORAL_TABLET | ORAL | Status: DC | PRN

## 2015-01-02 MED ORDER — ONDANSETRON HCL 4 MG/2ML IJ SOLN
INTRAMUSCULAR | Status: DC | PRN
  Administered 2015-01-02 (×2): 4 mg via INTRAVENOUS

## 2015-01-02 MED ORDER — GLYCOPYRROLATE 0.2 MG/ML IJ SOLN
INTRAMUSCULAR | Status: AC
  Filled 2015-01-02: qty 4

## 2015-01-02 MED ORDER — HYDROMORPHONE HCL 1 MG/ML IJ SOLN: 0.2500 mg | INTRAMUSCULAR | Status: DC | PRN

## 2015-01-02 SURGICAL SUPPLY — 53 items
BAG DECANTER FOR FLEXI CONT (MISCELLANEOUS) ×3 IMPLANT
BENZOIN TINCTURE PRP APPL 2/3 (GAUZE/BANDAGES/DRESSINGS) ×3 IMPLANT
BLADE CLIPPER SURG (BLADE) IMPLANT
BRUSH SCRUB EZ PLAIN DRY (MISCELLANEOUS) ×3 IMPLANT
BUR MATCHSTICK NEURO 3.0 LAGG (BURR) ×3 IMPLANT
BUR PRECISION FLUTE 6.0 (BURR) ×3 IMPLANT
CANISTER SUCT 3000ML PPV (MISCELLANEOUS) ×3 IMPLANT
CLOSURE WOUND 1/2 X4 (GAUZE/BANDAGES/DRESSINGS) ×1
CONT SPEC 4OZ CLIKSEAL STRL BL (MISCELLANEOUS) ×3 IMPLANT
DRAPE LAPAROTOMY 100X72X124 (DRAPES) ×3 IMPLANT
DRAPE MICROSCOPE LEICA (MISCELLANEOUS) ×3 IMPLANT
DRAPE POUCH INSTRU U-SHP 10X18 (DRAPES) ×3 IMPLANT
DRAPE SURG 17X23 STRL (DRAPES) ×12 IMPLANT
ELECT BLADE 4.0 EZ CLEAN MEGAD (MISCELLANEOUS) ×3
ELECT REM PT RETURN 9FT ADLT (ELECTROSURGICAL) ×3
ELECTRODE BLDE 4.0 EZ CLN MEGD (MISCELLANEOUS) ×1 IMPLANT
ELECTRODE REM PT RTRN 9FT ADLT (ELECTROSURGICAL) ×1 IMPLANT
EVACUATOR 3/16  PVC DRAIN (DRAIN) ×2
EVACUATOR 3/16 PVC DRAIN (DRAIN) ×1 IMPLANT
GAUZE SPONGE 4X4 12PLY STRL (GAUZE/BANDAGES/DRESSINGS) ×3 IMPLANT
GAUZE SPONGE 4X4 16PLY XRAY LF (GAUZE/BANDAGES/DRESSINGS) ×3 IMPLANT
GLOVE BIO SURGEON STRL SZ8 (GLOVE) ×3 IMPLANT
GLOVE BIO SURGEON STRL SZ8.5 (GLOVE) ×3 IMPLANT
GLOVE EXAM NITRILE LRG STRL (GLOVE) IMPLANT
GLOVE EXAM NITRILE MD LF STRL (GLOVE) IMPLANT
GLOVE EXAM NITRILE XL STR (GLOVE) IMPLANT
GLOVE EXAM NITRILE XS STR PU (GLOVE) IMPLANT
GOWN STRL REUS W/ TWL LRG LVL3 (GOWN DISPOSABLE) IMPLANT
GOWN STRL REUS W/ TWL XL LVL3 (GOWN DISPOSABLE) ×1 IMPLANT
GOWN STRL REUS W/TWL 2XL LVL3 (GOWN DISPOSABLE) IMPLANT
GOWN STRL REUS W/TWL LRG LVL3 (GOWN DISPOSABLE)
GOWN STRL REUS W/TWL XL LVL3 (GOWN DISPOSABLE) ×2
KIT BASIN OR (CUSTOM PROCEDURE TRAY) ×3 IMPLANT
KIT ROOM TURNOVER OR (KITS) ×3 IMPLANT
NEEDLE HYPO 21X1.5 SAFETY (NEEDLE) IMPLANT
NEEDLE HYPO 22GX1.5 SAFETY (NEEDLE) ×3 IMPLANT
NS IRRIG 1000ML POUR BTL (IV SOLUTION) ×3 IMPLANT
PACK LAMINECTOMY NEURO (CUSTOM PROCEDURE TRAY) ×3 IMPLANT
PAD ARMBOARD 7.5X6 YLW CONV (MISCELLANEOUS) ×9 IMPLANT
PATTIES SURGICAL .5 X1 (DISPOSABLE) IMPLANT
RUBBERBAND STERILE (MISCELLANEOUS) ×6 IMPLANT
SPONGE SURGIFOAM ABS GEL SZ50 (HEMOSTASIS) ×3 IMPLANT
STRIP CLOSURE SKIN 1/2X4 (GAUZE/BANDAGES/DRESSINGS) ×2 IMPLANT
SUT VIC AB 1 CT1 18XBRD ANBCTR (SUTURE) ×2 IMPLANT
SUT VIC AB 1 CT1 8-18 (SUTURE) ×4
SUT VIC AB 2-0 CP2 18 (SUTURE) ×9 IMPLANT
SYR 20CC LL (SYRINGE) IMPLANT
SYR 20ML ECCENTRIC (SYRINGE) ×3 IMPLANT
TAPE CLOTH SURG 4X10 WHT LF (GAUZE/BANDAGES/DRESSINGS) ×3 IMPLANT
TAPE STRIPS DRAPE STRL (GAUZE/BANDAGES/DRESSINGS) ×3 IMPLANT
TOWEL OR 17X24 6PK STRL BLUE (TOWEL DISPOSABLE) ×3 IMPLANT
TOWEL OR 17X26 10 PK STRL BLUE (TOWEL DISPOSABLE) ×3 IMPLANT
WATER STERILE IRR 1000ML POUR (IV SOLUTION) ×3 IMPLANT

## 2015-01-02 NOTE — Anesthesia Procedure Notes (Signed)
Date/Time: 01/02/2015 7:40 AM Performed by: Scheryl Darter Pre-anesthesia Checklist: Patient identified, Emergency Drugs available, Suction available and Timeout performed Patient Re-evaluated:Patient Re-evaluated prior to inductionOxygen Delivery Method: Circle system utilized Preoxygenation: Pre-oxygenation with 100% oxygen Intubation Type: IV induction and Rapid sequence Ventilation: Mask ventilation without difficulty Laryngoscope Size: Miller and 2 Grade View: Grade I Tube type: Oral Tube size: 7.5 mm Number of attempts: 1 Airway Equipment and Method: Stylet Placement Confirmation: ETT inserted through vocal cords under direct vision,  positive ETCO2 and breath sounds checked- equal and bilateral Secured at: 22 cm Tube secured with: Tape Dental Injury: Teeth and Oropharynx as per pre-operative assessment

## 2015-01-02 NOTE — Addendum Note (Signed)
Addendum  created 01/02/15 1241 by Scheryl Darter, CRNA   Modules edited: Anesthesia Attestations

## 2015-01-02 NOTE — Progress Notes (Signed)
Patient ID: Beth Kaufman, female   DOB: March 25, 1956, 59 y.o.   MRN: [id number] Subjective:  The patient is somnolent but arousable. She is in no apparent distress.  Objective: Vital signs in last 24 hours: Temp:  [97.2 F (36.2 C)-99.3 F (37.4 C)] 98.6 F (37 C) (05/23 1030) Pulse Rate:  [83-105] 94 (05/23 0622) Resp:  [16-18] 18 (05/23 0622) BP: (104-150)/(59-99) 150/87 mmHg (05/23 0622) SpO2:  [95 %-100 %] 100 % (05/23 0622)  Intake/Output from previous day:   Intake/Output this shift: Total I/O In: 2000 [I.V.:2000] Out: 550 [Blood:550]  Physical exam the patient is somnolent but arousable. She is moving her lower extremities well.  Lab Results:  Recent Labs  01/01/15 1100  WBC 6.5  HGB 11.0*  HCT 35.6*  PLT 321   BMET  Recent Labs  01/01/15 1100  NA 138  K 3.3*  CL 107  CO2 19*  GLUCOSE 143*  BUN 17  CREATININE 1.24*  CALCIUM 10.7*    Studies/Results: No results found.  Assessment/Plan: The patient is doing well.  LOS: 0 days     Beth Kaufman 01/02/2015, 10:45 AM

## 2015-01-02 NOTE — Anesthesia Postprocedure Evaluation (Signed)
  Anesthesia Post-op Note  Patient: Beth Kaufman  Procedure(s) Performed: Procedure(s) (LRB): LUMBAR LAMINECTOMY/DECOMPRESSION MICRODISCECTOMY 2 LEVELS (N/A)  Patient Location: PACU  Anesthesia Type: General  Level of Consciousness: awake and alert   Airway and Oxygen Therapy: Patient Spontanous Breathing  Post-op Pain: mild  Post-op Assessment: Post-op Vital signs reviewed, Patient's Cardiovascular Status Stable, Respiratory Function Stable, Patent Airway and No signs of Nausea or vomiting  Last Vitals:  Filed Vitals:   01/02/15 1107  BP: 150/70  Pulse: 91  Temp: 36 C  Resp: 16    Post-op Vital Signs: stable   Complications: No apparent anesthesia complications

## 2015-01-02 NOTE — Op Note (Signed)
Brief history: The patient is a 59 year old white female who has had chronic back pain. She has been worked up with him our x-rays and a lumbar MRI which demonstrated a thoraco lumboscoliosis, and diffuse degenerative changes. She has severe spinal stenosis at L2-3 and L3-4. I discussed the various treatment options with the patient including surgery. She has weighed the risks, benefits, and alternative surgery and decided proceed with an L2-3 and L3-4 laminectomy.  Preoperative diagnosis: L2-3 and L3-4 spinal stenosis, lumbar radiculopathy, neurogenic claudication, thoraco lumbar scoliosis  Postoperative diagnosis: Same  Procedure: L2 and L3 laminectomy to decompress the bilateral L2, L3 and L4 nerve roots   Surgeon: Dr. Earle Gell  Asst.: Dr. Kristeen Miss  Anesthesia: Gen. endotracheal  Estimated blood loss: 450 mL  Drains: One large Hemovac in the epidural space  Complications: None  Description of procedure: The patient was brought to the operating room by the anesthesia team. General endotracheal anesthesia was induced. The patient was turned to the prone position on the Wilson frame. The patient's lumbosacral region was then prepared with Betadine scrub and Betadine solution. Sterile drapes were applied.  I then injected the area to be incised with Marcaine with epinephrine solution. I then used a scalpel to make a linear midline incision over the L2-3 and L3-4 intervertebral disc space. I then used electrocautery to perform a bilateral subperiosteal dissection exposing the spinous process and lamina ofL2, L3 and L4.we did encounter quite a bit of bleeding from the paraspinous musculatures on the way in. We controlled this with electrocautery. We checked the patient's coags which turned out normal. We obtained intraoperative radiograph to confirm our location. I then inserted the [facility] retractor for exposure. I began the decompression by incising the interspinous ligament at L1-2,  L2-3 and L3-4. I used the Leksell rongeur to remove the spinous process of L2 and L3.   I used a high-speed drill to perform a laminotomy atL1, L2 and L3 bilaterally. I then used a Kerrison punches tocomplete the laminectomy at L2 and L3 and to  widen the laminotomies bilaterally at L1  and removed the ligamentum flavum atL1-2, L2-3 and L3-4. I also used the Kerrison punches to remove the cephalad aspect of the L4 lamina. We then  freed up the thecal sac and thbilateral L2, L3 and L4 nerve root from the epidural tissue. I then used a Kerrison punch to perform a foraminotomy at about thbilateral L2, L3 and L4 nerve root. we inspected the intervertebral disc bilaterally at L2-3 and L3-4. There was no significant herniations. We did not perform a discectomy.  I then palpated along the ventral surface of the thecal sac and along exit route of thbilateral L2, L3 and L4 nerve root and noted that the neural structures were well decompressed. This completed the decompression.  We then obtained hemostasis using bipolar electrocautery. We irrigated the wound out with bacitracin solution. We then removed the reI placed a large Hemovac drain in the epidural space and tunneled it out through a separate stab wound.We then reapproximated the patient's thoracolumbar fascia with interrupted #1 Vicryl suture. We then reapproximated the patient's subcutaneous tissue with interrupted 2-0 Vicryl suture. We then reapproximated patient's skin with Steri-Strips and benzoin. The was then coated with bacitracin ointment. The drapes were removed. The patient was subsequently returned to the supine position where they were extubated by the anesthesia team. The patient was then transported to the postanesthesia care unit in stable condition. All sponge instrument and needle counts were reportedly  correct at the end of this case.

## 2015-01-02 NOTE — H&P (Signed)
Subjective: The patient is a 59 year old white female who has complained of back and right greater left buttock and leg pain consistent with neurogenic claudication. L medical management and was worked up with a lumbar MRI which demonstrates spinal stenosis at L2-3, L3-4 as well as diffuse lumbar degenerative changes. I discussed the situation with the patient. She has decided to proceed with surgery.   Past Medical History  Diagnosis Date  . Sleep apnea     BiPAP  . Hypertension   . Shortness of breath dyspnea     with exertion  . Urinary frequency   . Anemia   . Ulcer of anus     treated at wound center  . Neuromuscular disorder     peripheral neuropathy  . GERD (gastroesophageal reflux disease)   . Hemorrhoids   . Diabetes mellitus without complication   . Depression   . PONV (postoperative nausea and vomiting)   . Dizziness   . Difficulty swallowing pills     Past Surgical History  Procedure Laterality Date  . Abdominal hysterectomy    . Back surgery  1993  . Hip fracture surgery Left     partial replacement  . Fracture surgery Right     broken ankle  . Broken ankle Right 1996    screws and plates    Allergies  Allergen Reactions  . Morphine Nausea And Vomiting and Rash  . Terbinafine Rash    History  Substance Use Topics  . Smoking status: Former Smoker -- 1.00 packs/day for 20 years    Types: Cigarettes    Quit date: 08/12/1994  . Smokeless tobacco: Not on file  . Alcohol Use: No     Comment: rarely    History reviewed. No pertinent family history. Prior to Admission medications   Medication Sig Start Date End Date Taking? Authorizing Provider  bismuth subsalicylate (PEPTO BISMOL) 262 MG/15ML suspension Take 30 mLs by mouth every 6 (six) hours as needed for indigestion.   Yes Historical Provider, MD  diazepam (VALIUM) 5 MG tablet Take 5 mg by mouth every 12 (twelve) hours as needed for anxiety.   Yes Historical Provider, MD  glipiZIDE (GLUCOTROL) 10 MG  tablet Take 10 mg by mouth 2 (two) times daily before a meal.    Yes Historical Provider, MD  LORazepam (ATIVAN) 0.5 MG tablet Take 1 tablet (0.5 mg total) by mouth at bedtime. 01/01/15  Yes Christopher Lawyer, PA-C  potassium chloride SA (K-DUR,KLOR-CON) 20 MEQ tablet Take 20 mEq by mouth 2 (two) times daily.  12/23/14 12/23/15 Yes Historical Provider, MD  Sennosides-Docusate Sodium (SENNA PLUS PO) Take 1 tablet by mouth daily as needed.   Yes Historical Provider, MD  sertraline (ZOLOFT) 100 MG tablet TAKE ONE TABLET BY MOUTH EVERY MORNING 02/14/14  Yes Historical Provider, MD  Simethicone (MYLANTA GAS PO) Take 2 tablets by mouth daily as needed.   Yes Historical Provider, MD  temazepam (RESTORIL) 30 MG capsule Take 30 mg by mouth at bedtime as needed for sleep.    Yes Historical Provider, MD  triamterene-hydrochlorothiazide (DYAZIDE) 37.5-25 MG per capsule Take 1 capsule by mouth daily.   Yes Historical Provider, MD     Review of Systems  Positive ROS: As above  All other systems have been reviewed and were otherwise negative with the exception of those mentioned in the HPI and as above.  Objective: Vital signs in last 24 hours: Temp:  [97.2 F (36.2 C)-99.3 F (37.4 C)] 97.2 F (36.2 C) (05/23  3976) Pulse Rate:  [83-105] 94 (05/23 0622) Resp:  [16-18] 18 (05/23 0622) BP: (104-150)/(59-99) 150/87 mmHg (05/23 0622) SpO2:  [95 %-100 %] 100 % (05/23 0622) Weight:  [88.451 kg (195 lb)] 88.451 kg (195 lb) (05/22 1045)  General Appearance: Alert, cooperative, no distress, Head: Normocephalic, without obvious abnormality, atraumatic Eyes: PERRL, conjunctiva/corneas clear, EOM's intact,    Ears: Normal  Throat: Normal  Neck: Supple, symmetrical, trachea midline, no adenopathy; thyroid: No enlargement/tenderness/nodules; no carotid bruit or JVD Back: Symmetric, no curvature, ROM normal, no CVA tenderness Lungs: Clear to auscultation bilaterally, respirations unlabored Heart: Regular rate and  rhythm, no murmur, rub or gallop Abdomen: Soft, non-tender,, no masses, no organomegaly Extremities: Extremities normal, atraumatic, no cyanosis or edema Pulses: 2+ and symmetric all extremities Skin: Skin color, texture, turgor normal, no rashes or lesions  NEUROLOGIC:   Mental status: alert and oriented, no aphasia, good attention span, Fund of knowledge/ memory ok Motor Exam - grossly normal Sensory Exam - grossly normal Reflexes:  Coordination - grossly normal Gait - grossly normal Balance - grossly normal Cranial Nerves: I: smell Not tested  II: visual acuity  OS: Normal  OD: Normal   II: visual fields Full to confrontation  II: pupils Equal, round, reactive to light  III,VII: ptosis None  III,IV,VI: extraocular muscles  Full ROM  V: mastication Normal  V: facial light touch sensation  Normal  V,VII: corneal reflex  Present  VII: facial muscle function - upper  Normal  VII: facial muscle function - lower Normal  VIII: hearing Not tested  IX: soft palate elevation  Normal  IX,X: gag reflex Present  XI: trapezius strength  5/5  XI: sternocleidomastoid strength 5/5  XI: neck flexion strength  5/5  XII: tongue strength  Normal    Data Review Lab Results  Component Value Date   WBC 6.5 01/01/2015   HGB 11.0* 01/01/2015   HCT 35.6* 01/01/2015   MCV 66.4* 01/01/2015   PLT 321 01/01/2015   Lab Results  Component Value Date   NA 138 01/01/2015   K 3.3* 01/01/2015   CL 107 01/01/2015   CO2 19* 01/01/2015   BUN 17 01/01/2015   CREATININE 1.24* 01/01/2015   GLUCOSE 143* 01/01/2015   No results found for: INR, PROTIME  Assessment/Plan: L2-3 and L3-4 spinal stenosis, lumbago, lumbar radiculopathy, neurogenic claudication: I have discussed the situation with the patient. I have reviewed her imaging studies with her and pointed out the abnormalities. We have discussed the various treatment options including surgery. I have described the surgical treatment option of the  L2-3 and L3-4 laminectomy. I have shown her surgical models. We have discussed the risks, benefits, alternatives, and likelihood of achieving her goals with surgery. I have answered all the patient's questions. She has decided to proceed with surgery.   Beth Kaufman D 01/02/2015 7:14 AM

## 2015-01-02 NOTE — Anesthesia Preprocedure Evaluation (Signed)
Anesthesia Evaluation  Patient identified by MRN, date of birth, ID band Patient awake    Reviewed: Allergy & Precautions, NPO status , Patient's Chart, lab work & pertinent test results  Airway Mallampati: II  TM Distance: >3 FB Neck ROM: Full    Dental no notable dental hx.    Pulmonary sleep apnea and Continuous Positive Airway Pressure Ventilation , former smoker,  breath sounds clear to auscultation  Pulmonary exam normal       Cardiovascular hypertension, Pt. on medications Normal cardiovascular examRhythm:Regular Rate:Normal     Neuro/Psych negative neurological ROS  negative psych ROS   GI/Hepatic negative GI ROS, Neg liver ROS,   Endo/Other  diabetes  Renal/GU negative Renal ROS  negative genitourinary   Musculoskeletal negative musculoskeletal ROS (+)   Abdominal   Peds negative pediatric ROS (+)  Hematology negative hematology ROS (+)   Anesthesia Other Findings   Reproductive/Obstetrics negative OB ROS                             Anesthesia Physical Anesthesia Plan  ASA: III  Anesthesia Plan: General   Post-op Pain Management:    Induction: Intravenous  Airway Management Planned: Oral ETT  Additional Equipment:   Intra-op Plan:   Post-operative Plan: Extubation in OR  Informed Consent: I have reviewed the patients History and Physical, chart, labs and discussed the procedure including the risks, benefits and alternatives for the proposed anesthesia with the patient or authorized representative who has indicated his/her understanding and acceptance.   Dental advisory given  Plan Discussed with: CRNA and Surgeon  Anesthesia Plan Comments:         Anesthesia Quick Evaluation

## 2015-01-02 NOTE — Transfer of Care (Signed)
Immediate Anesthesia Transfer of Care Note  Patient: Beth Kaufman  Procedure(s) Performed: Procedure(s) with comments: LUMBAR LAMINECTOMY/DECOMPRESSION MICRODISCECTOMY 2 LEVELS (N/A) - L23 L34 laminectomies  Patient Location: PACU  Anesthesia Type:General  Level of Consciousness: awake, alert , oriented and sedated  Airway & Oxygen Therapy: Patient Spontanous Breathing and Patient connected to nasal cannula oxygen  Post-op Assessment: Report given to RN, Post -op Vital signs reviewed and stable and Patient moving all extremities  Post vital signs: Reviewed and stable  Last Vitals:  Filed Vitals:   01/02/15 0622  BP: 150/87  Pulse: 94  Temp: 36.2 C  Resp: 18    Complications: No apparent anesthesia complications

## 2015-01-02 NOTE — Plan of Care (Signed)
Problem: Consults Goal: Diagnosis - Spinal Surgery Outcome: Completed/Met Date Met:  01/02/15 Lumbar Laminectomy (Complex)     

## 2015-01-03 ENCOUNTER — Encounter (HOSPITAL_COMMUNITY): Payer: Self-pay | Admitting: Neurosurgery

## 2015-01-03 LAB — BASIC METABOLIC PANEL
Anion gap: 4 — ABNORMAL LOW (ref 5–15)
BUN: 17 mg/dL (ref 6–20)
CO2: 25 mmol/L (ref 22–32)
CREATININE: 1.28 mg/dL — AB (ref 0.44–1.00)
Calcium: 9.6 mg/dL (ref 8.9–10.3)
Chloride: 108 mmol/L (ref 101–111)
GFR calc Af Amer: 52 mL/min — ABNORMAL LOW (ref >60)
GFR calc non Af Amer: 45 mL/min — ABNORMAL LOW (ref >60)
Glucose, Bld: 94 mg/dL (ref 65–99)
POTASSIUM: 3.7 mmol/L (ref 3.5–5.1)
SODIUM: 137 mmol/L (ref 135–145)

## 2015-01-03 LAB — CBC
HCT: 25.1 % — ABNORMAL LOW (ref 36.0–46.0)
Hemoglobin: 7.6 g/dL — ABNORMAL LOW (ref 12.0–15.0)
MCH: 20.7 pg — ABNORMAL LOW (ref 26.0–34.0)
MCHC: 30.3 g/dL (ref 30.0–36.0)
MCV: 68.2 fL — ABNORMAL LOW (ref 78.0–100.0)
Platelets: 247 10*3/uL (ref 150–400)
RBC: 3.68 MIL/uL — AB (ref 3.87–5.11)
RDW: 17.5 % — AB (ref 11.5–15.5)
WBC: 5.6 10*3/uL (ref 4.0–10.5)

## 2015-01-03 LAB — GLUCOSE, CAPILLARY: Glucose-Capillary: 70 mg/dL (ref 65–99)

## 2015-01-03 MED ORDER — OXYCODONE-ACETAMINOPHEN 10-325 MG PO TABS: 1.0000 | ORAL_TABLET | ORAL | Status: AC | PRN

## 2015-01-03 MED ORDER — DOCUSATE SODIUM 100 MG PO CAPS: 100.0000 mg | ORAL_CAPSULE | Freq: Two times a day (BID) | ORAL | Status: AC

## 2015-01-03 MED ORDER — DIAZEPAM 5 MG PO TABS: 5.0000 mg | ORAL_TABLET | Freq: Four times a day (QID) | ORAL | Status: AC | PRN

## 2015-01-03 NOTE — Progress Notes (Signed)
PT Cancellation Note  Patient Details Name: Beth Kaufman MRN: [id number] DOB: 11-03-1955   Cancelled Treatment:    Reason Eval/Treat Not Completed: Other (comment). Pt d/c'd prior to PT eval.   Lorriane Shire 01/03/2015, 9:12 AM

## 2015-01-03 NOTE — Progress Notes (Signed)
Discharge instructions given. Pt verbalized understanding and all questions were answered.  

## 2015-01-03 NOTE — Discharge Summary (Signed)
Physician Discharge Summary  Patient ID: Beth Kaufman MRN: [id number] DOB/AGE: 1956/04/19 59 y.o.  Admit date: 01/02/2015 Discharge date: 01/03/2015  Admission Diagnoses: L2-3, L3-4 spinal stenosis, lumbago, lumbar radiculopathy, thoracolumbar scoliosis, neurogenic claudication  Discharge Diagnoses: The same Active Problems:   Lumbar stenosis with neurogenic claudication   Discharged Condition: good  Hospital Course: I performed an L2-3 and L3-4 laminectomy on 01/02/2015. The surgery went well.  The patient's postoperative course was unremarkable. On postoperative day #1 the patient requested discharge to home. The patient, and her husband, were given oral and written discharge instructions. All their questions were answered.  Consults: None Significant Diagnostic Studies: None Treatments: L2-3 and L3-4 laminectomy to decompress the bilateral L2, L3 and L4 nerve roots Discharge Exam: Blood pressure 118/73, pulse 87, temperature 98.2 F (36.8 C), temperature source Oral, resp. rate 18, SpO2 98 %. The patient is alert and pleasant. Her strength is grossly normal in her lower extremities.  Disposition: Home  Discharge Instructions    Call MD for:  difficulty breathing, headache or visual disturbances    Complete by:  As directed      Call MD for:  extreme fatigue    Complete by:  As directed      Call MD for:  hives    Complete by:  As directed      Call MD for:  persistant dizziness or light-headedness    Complete by:  As directed      Call MD for:  persistant nausea and vomiting    Complete by:  As directed      Call MD for:  redness, tenderness, or signs of infection (pain, swelling, redness, odor or green/yellow discharge around incision site)    Complete by:  As directed      Call MD for:  severe uncontrolled pain    Complete by:  As directed      Call MD for:  temperature >100.4    Complete by:  As directed      Diet - low sodium heart healthy    Complete by:  As  directed      Discharge instructions    Complete by:  As directed   Call [phone] for a followup appointment. Take a stool softener while you are using pain medications.     Driving Restrictions    Complete by:  As directed   Do not drive for 2 weeks.     Increase activity slowly    Complete by:  As directed      Lifting restrictions    Complete by:  As directed   Do not lift more than 5 pounds. No excessive bending or twisting.     May shower / Bathe    Complete by:  As directed   He may shower after the pain she is removed 3 days after surgery. Leave the incision alone.     Remove dressing in 48 hours    Complete by:  As directed   Your stitches are under the scan and will dissolve by themselves. The Steri-Strips will fall off after you take a few showers. Do not rub back or pick at the wound, Leave the wound alone.            Medication List    STOP taking these medications        LORazepam 0.5 MG tablet  Commonly known as:  ATIVAN      TAKE these medications  bismuth subsalicylate 384 TX/64WO suspension  Commonly known as:  PEPTO BISMOL  Take 30 mLs by mouth every 6 (six) hours as needed for indigestion.     diazepam 5 MG tablet  Commonly known as:  VALIUM  Take 1 tablet (5 mg total) by mouth every 6 (six) hours as needed for muscle spasms.     docusate sodium 100 MG capsule  Commonly known as:  COLACE  Take 1 capsule (100 mg total) by mouth 2 (two) times daily.     glipiZIDE 10 MG tablet  Commonly known as:  GLUCOTROL  Take 10 mg by mouth 2 (two) times daily before a meal.     MYLANTA GAS PO  Take 2 tablets by mouth daily as needed.     oxyCODONE-acetaminophen 10-325 MG per tablet  Commonly known as:  PERCOCET  Take 1 tablet by mouth every 4 (four) hours as needed for pain.     potassium chloride SA 20 MEQ tablet  Commonly known as:  K-DUR,KLOR-CON  Take 20 mEq by mouth 2 (two) times daily.     SENNA PLUS PO  Take 1 tablet by mouth daily as  needed.     sertraline 100 MG tablet  Commonly known as:  ZOLOFT  TAKE ONE TABLET BY MOUTH EVERY MORNING     temazepam 30 MG capsule  Commonly known as:  RESTORIL  Take 30 mg by mouth at bedtime as needed for sleep.     triamterene-hydrochlorothiazide 37.5-25 MG per capsule  Commonly known as:  DYAZIDE  Take 1 capsule by mouth daily.         SignedOphelia Charter 01/03/2015, 7:47 AM

## 2015-01-16 ENCOUNTER — Encounter (HOSPITAL_BASED_OUTPATIENT_CLINIC_OR_DEPARTMENT_OTHER): Payer: BLUE CROSS/BLUE SHIELD | Attending: Plastic Surgery

## 2017-10-22 ENCOUNTER — Other Ambulatory Visit (HOSPITAL_BASED_OUTPATIENT_CLINIC_OR_DEPARTMENT_OTHER): Payer: Self-pay

## 2017-10-22 DIAGNOSIS — G4710 Hypersomnia, unspecified: Secondary | ICD-10-CM

## 2017-10-22 DIAGNOSIS — R0683 Snoring: Secondary | ICD-10-CM

## 2017-11-04 ENCOUNTER — Other Ambulatory Visit: Payer: Self-pay

## 2017-11-04 ENCOUNTER — Inpatient Hospital Stay
Admission: EM | Admit: 2017-11-04 | Discharge: 2017-11-05 | DRG: 100 | Disposition: A | Discharge status: Home Health | Payer: Medicaid Other | Attending: Internal Medicine | Admitting: Internal Medicine

## 2017-11-04 ENCOUNTER — Emergency Department: Payer: Medicaid Other

## 2017-11-04 ENCOUNTER — Encounter: Payer: Self-pay | Admitting: Emergency Medicine

## 2017-11-04 ENCOUNTER — Inpatient Hospital Stay: Payer: Medicaid Other

## 2017-11-04 DIAGNOSIS — G8194 Hemiplegia, unspecified affecting left nondominant side: Secondary | ICD-10-CM | POA: Diagnosis present

## 2017-11-04 DIAGNOSIS — G4089 Other seizures: Principal | ICD-10-CM | POA: Diagnosis present

## 2017-11-04 DIAGNOSIS — G939 Disorder of brain, unspecified: Secondary | ICD-10-CM | POA: Diagnosis not present

## 2017-11-04 DIAGNOSIS — D320 Benign neoplasm of cerebral meninges: Secondary | ICD-10-CM | POA: Diagnosis present

## 2017-11-04 DIAGNOSIS — C181 Malignant neoplasm of appendix: Secondary | ICD-10-CM | POA: Diagnosis present

## 2017-11-04 DIAGNOSIS — Z79899 Other long term (current) drug therapy: Secondary | ICD-10-CM

## 2017-11-04 DIAGNOSIS — Z885 Allergy status to narcotic agent status: Secondary | ICD-10-CM | POA: Diagnosis not present

## 2017-11-04 DIAGNOSIS — Z888 Allergy status to other drugs, medicaments and biological substances status: Secondary | ICD-10-CM | POA: Diagnosis not present

## 2017-11-04 DIAGNOSIS — E1142 Type 2 diabetes mellitus with diabetic polyneuropathy: Secondary | ICD-10-CM | POA: Diagnosis present

## 2017-11-04 DIAGNOSIS — K219 Gastro-esophageal reflux disease without esophagitis: Secondary | ICD-10-CM | POA: Diagnosis present

## 2017-11-04 DIAGNOSIS — F329 Major depressive disorder, single episode, unspecified: Secondary | ICD-10-CM | POA: Diagnosis present

## 2017-11-04 DIAGNOSIS — D330 Benign neoplasm of brain, supratentorial: Secondary | ICD-10-CM | POA: Diagnosis not present

## 2017-11-04 DIAGNOSIS — R000 Tachycardia, unspecified: Secondary | ICD-10-CM | POA: Diagnosis present

## 2017-11-04 DIAGNOSIS — Z87891 Personal history of nicotine dependence: Secondary | ICD-10-CM | POA: Diagnosis not present

## 2017-11-04 DIAGNOSIS — Z7984 Long term (current) use of oral hypoglycemic drugs: Secondary | ICD-10-CM

## 2017-11-04 DIAGNOSIS — R251 Tremor, unspecified: Secondary | ICD-10-CM | POA: Diagnosis present

## 2017-11-04 DIAGNOSIS — R918 Other nonspecific abnormal finding of lung field: Secondary | ICD-10-CM | POA: Diagnosis present

## 2017-11-04 DIAGNOSIS — G936 Cerebral edema: Secondary | ICD-10-CM | POA: Diagnosis present

## 2017-11-04 DIAGNOSIS — H547 Unspecified visual loss: Secondary | ICD-10-CM | POA: Diagnosis present

## 2017-11-04 DIAGNOSIS — I10 Essential (primary) hypertension: Secondary | ICD-10-CM | POA: Diagnosis present

## 2017-11-04 DIAGNOSIS — C786 Secondary malignant neoplasm of retroperitoneum and peritoneum: Secondary | ICD-10-CM | POA: Diagnosis present

## 2017-11-04 DIAGNOSIS — G4730 Sleep apnea, unspecified: Secondary | ICD-10-CM | POA: Diagnosis present

## 2017-11-04 DIAGNOSIS — G9389 Other specified disorders of brain: Secondary | ICD-10-CM | POA: Diagnosis present

## 2017-11-04 DIAGNOSIS — R569 Unspecified convulsions: Secondary | ICD-10-CM | POA: Diagnosis not present

## 2017-11-04 HISTORY — DX: Malignant (primary) neoplasm, unspecified: C80.1

## 2017-11-04 HISTORY — DX: Secondary malignant neoplasm of retroperitoneum and peritoneum: C78.6

## 2017-11-04 LAB — PROTIME-INR
INR: 1.17
Prothrombin Time: 14.8 seconds (ref 11.4–15.2)

## 2017-11-04 LAB — COMPREHENSIVE METABOLIC PANEL
ALT: 15 U/L (ref 14–54)
AST: 25 U/L (ref 15–41)
Albumin: 3 g/dL — ABNORMAL LOW (ref 3.5–5.0)
Alkaline Phosphatase: 106 U/L (ref 38–126)
Anion gap: 8 (ref 5–15)
BILIRUBIN TOTAL: 0.8 mg/dL (ref 0.3–1.2)
BUN: 14 mg/dL (ref 6–20)
CHLORIDE: 109 mmol/L (ref 101–111)
CO2: 22 mmol/L (ref 22–32)
CREATININE: 1.06 mg/dL — AB (ref 0.44–1.00)
Calcium: 8 mg/dL — ABNORMAL LOW (ref 8.9–10.3)
GFR calc Af Amer: >60 mL/min (ref >60)
GFR calc non Af Amer: 55 mL/min — ABNORMAL LOW (ref >60)
Glucose, Bld: 176 mg/dL — ABNORMAL HIGH (ref 65–99)
Potassium: 2.8 mmol/L — ABNORMAL LOW (ref 3.5–5.1)
Sodium: 139 mmol/L (ref 135–145)
Total Protein: 6.2 g/dL — ABNORMAL LOW (ref 6.5–8.1)

## 2017-11-04 LAB — CBC WITH DIFFERENTIAL/PLATELET
BASOS ABS: 0 10*3/uL (ref 0–0.1)
Basophils Relative: 0 %
Eosinophils Absolute: 0.1 10*3/uL (ref 0–0.7)
Eosinophils Relative: 1 %
HEMATOCRIT: 24.7 % — AB (ref 35.0–47.0)
Hemoglobin: 8 g/dL — ABNORMAL LOW (ref 12.0–16.0)
Lymphocytes Relative: 8 %
Lymphs Abs: 0.5 10*3/uL — ABNORMAL LOW (ref 1.0–3.6)
MCH: 25.7 pg — ABNORMAL LOW (ref 26.0–34.0)
MCHC: 32.2 g/dL (ref 32.0–36.0)
MCV: 79.9 fL — ABNORMAL LOW (ref 80.0–100.0)
Monocytes Absolute: 0.5 10*3/uL (ref 0.2–0.9)
Monocytes Relative: 9 %
NEUTROS ABS: 4.8 10*3/uL (ref 1.4–6.5)
NEUTROS PCT: 82 %
PLATELETS: 111 10*3/uL — AB (ref 150–440)
RBC: 3.09 MIL/uL — AB (ref 3.80–5.20)
RDW: 22.2 % — ABNORMAL HIGH (ref 11.5–14.5)
WBC: 5.9 10*3/uL (ref 3.6–11.0)

## 2017-11-04 LAB — URINALYSIS, COMPLETE (UACMP) WITH MICROSCOPIC
Bacteria, UA: NONE SEEN
Bilirubin Urine: NEGATIVE
Glucose, UA: 50 mg/dL — AB
Hgb urine dipstick: NEGATIVE
Ketones, ur: NEGATIVE mg/dL
Leukocytes, UA: NEGATIVE
Nitrite: NEGATIVE
Protein, ur: NEGATIVE mg/dL
RBC / HPF: NONE SEEN RBC/hpf (ref 0–5)
Specific Gravity, Urine: 1.009 (ref 1.005–1.030)
Squamous Epithelial / LPF: NONE SEEN
pH: 7 (ref 5.0–8.0)

## 2017-11-04 LAB — TSH: TSH: 3.817 u[IU]/mL (ref 0.350–4.500)

## 2017-11-04 LAB — HEMOGLOBIN A1C
Hgb A1c MFr Bld: 5.4 % (ref 4.8–5.6)
Mean Plasma Glucose: 108.28 mg/dL

## 2017-11-04 MED ORDER — SODIUM CHLORIDE 0.9% FLUSH: 10.0000 mL | INTRAVENOUS | Status: DC | PRN

## 2017-11-04 MED ORDER — ACETAMINOPHEN 650 MG RE SUPP: 650.0000 mg | Freq: Four times a day (QID) | RECTAL | Status: DC | PRN

## 2017-11-04 MED ORDER — ONDANSETRON HCL 4 MG PO TABS
4.0000 mg | ORAL_TABLET | Freq: Four times a day (QID) | ORAL | Status: DC | PRN
Start: 2017-11-04 — End: 2017-11-05

## 2017-11-04 MED ORDER — SODIUM CHLORIDE 0.9 % IV SOLN
1000.0000 mg | Freq: Once | INTRAVENOUS | Status: AC
  Administered 2017-11-04: 1000 mg via INTRAVENOUS
  Filled 2017-11-04: qty 10

## 2017-11-04 MED ORDER — POTASSIUM CHLORIDE CRYS ER 20 MEQ PO TBCR
40.0000 meq | EXTENDED_RELEASE_TABLET | Freq: Once | ORAL | Status: AC
  Administered 2017-11-04: 40 meq via ORAL
  Filled 2017-11-04: qty 2

## 2017-11-04 MED ORDER — ONDANSETRON HCL 4 MG/2ML IJ SOLN: 4.0000 mg | Freq: Four times a day (QID) | INTRAMUSCULAR | Status: DC | PRN

## 2017-11-04 MED ORDER — LEVETIRACETAM 750 MG PO TABS
750.0000 mg | ORAL_TABLET | Freq: Two times a day (BID) | ORAL | Status: DC
  Administered 2017-11-04: 750 mg via ORAL
  Filled 2017-11-04 (×2): qty 1

## 2017-11-04 MED ORDER — ACETAMINOPHEN 325 MG PO TABS
650.0000 mg | ORAL_TABLET | Freq: Four times a day (QID) | ORAL | Status: DC | PRN
Start: 2017-11-04 — End: 2017-11-05
  Administered 2017-11-05: 06:00:00 650 mg via ORAL
  Filled 2017-11-04: qty 2

## 2017-11-04 MED ORDER — SODIUM CHLORIDE 0.9 % IV SOLN
1000.0000 mg | Freq: Two times a day (BID) | INTRAVENOUS | Status: DC
  Filled 2017-11-04: qty 10

## 2017-11-04 MED ORDER — LORAZEPAM 2 MG/ML IJ SOLN
1.0000 mg | Freq: Once | INTRAMUSCULAR | Status: AC
  Administered 2017-11-04: 14:00:00 1 mg via INTRAVENOUS
  Filled 2017-11-04: qty 1

## 2017-11-04 MED ORDER — POTASSIUM CHLORIDE 20 MEQ PO PACK
PACK | ORAL | Status: AC
  Filled 2017-11-04: qty 2

## 2017-11-04 MED ORDER — DEXAMETHASONE SODIUM PHOSPHATE 10 MG/ML IJ SOLN
10.0000 mg | Freq: Once | INTRAMUSCULAR | Status: AC
  Administered 2017-11-04: 10 mg via INTRAVENOUS
  Filled 2017-11-04: qty 1

## 2017-11-04 MED ORDER — LEVETIRACETAM 750 MG PO TABS
750.0000 mg | ORAL_TABLET | Freq: Two times a day (BID) | ORAL | Status: DC
  Administered 2017-11-05: 750 mg via ORAL
  Filled 2017-11-04 (×2): qty 1

## 2017-11-04 MED ORDER — KCL IN DEXTROSE-NACL 40-5-0.45 MEQ/L-%-% IV SOLN
INTRAVENOUS | Status: DC
  Administered 2017-11-04 (×2): via INTRAVENOUS
  Filled 2017-11-04 (×4): qty 1000

## 2017-11-04 MED ORDER — DOCUSATE SODIUM 100 MG PO CAPS
100.0000 mg | ORAL_CAPSULE | Freq: Two times a day (BID) | ORAL | Status: DC
  Administered 2017-11-04 – 2017-11-05 (×2): 100 mg via ORAL
  Filled 2017-11-04 (×2): qty 1

## 2017-11-04 MED ORDER — GADOBENATE DIMEGLUMINE 529 MG/ML IV SOLN
20.0000 mL | Freq: Once | INTRAVENOUS | Status: AC | PRN
  Administered 2017-11-04: 15:00:00 19 mL via INTRAVENOUS

## 2017-11-04 MED ORDER — LEVETIRACETAM IN NACL 1000 MG/100ML IV SOLN: 1000.0000 mg | Freq: Once | INTRAVENOUS | Status: DC

## 2017-11-04 NOTE — Consult Note (Addendum)
Reason for Consult:Brain mass Referring Physician: Sudini  CC: Seizure  HPI: Beth Kaufman is an 62 y.o. female with a history of stage IV appendiceal cancer who presented to the ED after having an apparent seizure.  The patient reportedly had an event that started with uncontrolled movement of the left side.  Patient then became unconscious with generalized tonic-clonic activity that stopped on its own.  Patient was brought in by EMS.  Has returned to baseline today.      Past Medical History:  Diagnosis Date  . Anemia   . Depression   . Diabetes mellitus without complication ([place])   . Difficulty swallowing pills   . Dizziness   . GERD (gastroesophageal reflux disease)   . Hemorrhoids   . Hypertension   . Neuromuscular disorder ([place])    peripheral neuropathy  . Peritoneal carcinomatosis ([place])   . PONV (postoperative nausea and vomiting)   . Shortness of breath dyspnea    with exertion  . Sleep apnea    BiPAP  . Ulcer of anus    treated at wound center  . Urinary frequency     Past Surgical History:  Procedure Laterality Date  . ABDOMINAL HYSTERECTOMY    . BACK SURGERY  1993  . broken ankle Right 1996   screws and plates  . FRACTURE SURGERY Right    broken ankle  . HIP FRACTURE SURGERY Left    partial replacement  . LUMBAR LAMINECTOMY/DECOMPRESSION MICRODISCECTOMY N/A 01/02/2015   Procedure: LUMBAR LAMINECTOMY/DECOMPRESSION MICRODISCECTOMY 2 LEVELS;  Surgeon: Newman Pies, MD;  Location: [facility];  Service: Neurosurgery;  Laterality: N/A;  L23 L34 laminectomies    Family history: Paternal aunt with ovarian cancer and paternal uncle with cencer  Social History:  reports that she quit smoking about 23 years ago. Her smoking use included cigarettes. She has a 20.00 pack-year smoking history. She has never used smokeless tobacco. She reports that she does not drink alcohol or use drugs.  Allergies  Allergen Reactions  . Morphine Nausea And Vomiting and Rash  .  Terbinafine Rash    Medications:  I have reviewed the patient's current medications. Prior to Admission:  Medications Prior to Admission  Medication Sig Dispense Refill Last Dose  . chlorpheniramine-HYDROcodone (TUSSIONEX PENNKINETIC ER) 10-8 MG/5ML SUER Take 5 mLs by mouth every 12 (twelve) hours as needed for cough.   prn at prn  . dexamethasone (DECADRON) 2 MG tablet Take 2 mg by mouth daily.   11/03/2017 at 0800  . diazepam (VALIUM) 5 MG tablet Take 1 tablet (5 mg total) by mouth every 6 (six) hours as needed for muscle spasms. 50 tablet 1 prn at prn  . LORazepam (ATIVAN) 0.5 MG tablet Take 0.5 mg by mouth every 8 (eight) hours as needed for anxiety.   prn at prn  . magnesium hydroxide (MILK OF MAGNESIA) 400 MG/5ML suspension Take 30 mLs by mouth daily as needed for mild constipation.   prn at prn  . mirtazapine (REMERON) 15 MG tablet Take 15 mg by mouth at bedtime.   11/03/2017 at 2000  . OLANZapine (ZYPREXA) 5 MG tablet Take 5 mg by mouth at bedtime.   11/03/2017 at 2000  . ondansetron (ZOFRAN) 8 MG tablet Take 8 mg by mouth every 8 (eight) hours as needed for nausea or vomiting.   prn at prn  . oxyCODONE (OXY IR/ROXICODONE) 5 MG immediate release tablet Take 5-10 mg by mouth every 4 (four) hours as needed for severe pain. Up  to 6/ day   prn at prn  . oxyCODONE (OXYCONTIN) 10 mg 12 hr tablet Take 10 mg by mouth every 12 (twelve) hours.   11/03/2017 at 2100  . oxyCODONE-acetaminophen (PERCOCET) 10-325 MG per tablet Take 1 tablet by mouth every 4 (four) hours as needed for pain. 100 tablet 0 11/03/2017 at 2000  . pantoprazole (PROTONIX) 40 MG tablet Take 40 mg by mouth daily.   11/03/2017 at 0800  . potassium chloride SA (K-DUR,KLOR-CON) 20 MEQ tablet Take 20 mEq by mouth 2 (two) times daily.   11/03/2017 at 1800  . sertraline (ZOLOFT) 100 MG tablet TAKE ONE TABLET BY MOUTH EVERY MORNING   11/03/2017 at 0800  . triamcinolone (KENALOG) 0.025 % cream Apply 1 application topically 2 (two) times daily  as needed.    prn at prn  . docusate sodium (COLACE) 100 MG capsule Take 1 capsule (100 mg total) by mouth 2 (two) times daily. (Patient not taking: Reported on 11/04/2017) 60 capsule 0 Not Taking at Unknown time   Scheduled: . docusate sodium  100 mg Oral BID  . levETIRAcetam  750 mg Oral BID    ROS: History obtained from the patient  General ROS: negative for - chills, fatigue, fever, night sweats, weight gain or weight loss Psychological ROS: negative for - behavioral disorder, hallucinations, memory difficulties, mood swings or suicidal ideation Ophthalmic ROS: negative for - blurry vision, double vision, eye pain or loss of vision ENT ROS: negative for - epistaxis, nasal discharge, oral lesions, sore throat, tinnitus or vertigo Allergy and Immunology ROS: negative for - hives or itchy/watery eyes Hematological and Lymphatic ROS: negative for - bleeding problems, bruising or swollen lymph nodes Endocrine ROS: negative for - galactorrhea, hair pattern changes, polydipsia/polyuria or temperature intolerance Respiratory ROS: negative for - cough, hemoptysis, shortness of breath or wheezing Cardiovascular ROS: negative for - chest pain, dyspnea on exertion, edema or irregular heartbeat Gastrointestinal ROS: negative for - abdominal pain, diarrhea, hematemesis, nausea/vomiting or stool incontinence Genito-Urinary ROS: negative for - dysuria, hematuria, incontinence or urinary frequency/urgency Musculoskeletal ROS: generalized weakness Neurological ROS: as noted in HPI, left arm tremor Dermatological ROS: extensive bruising  Physical Examination: Blood pressure 121/62, pulse (!) 108, temperature 99 F (37.2 C), temperature source Oral, resp. rate 18, height 5\' 5"  (1.651 m), weight 91.3 kg (201 lb 4.8 oz), SpO2 98 %.  HEENT-  Normocephalic, no lesions, without obvious abnormality.  Normal external eye and conjunctiva.  Normal TM's bilaterally.  Normal auditory canals and external ears.  Normal external nose, mucus membranes and septum.  Normal pharynx. Cardiovascular- S1, S2 normal, pulses palpable throughout   Lungs- chest clear, no wheezing, rales, normal symmetric air entry Abdomen- soft, non-tender; bowel sounds normal; no masses,  no organomegaly Extremities- LLE edema Lymph-no adenopathy palpable Musculoskeletal-no joint tenderness, deformity or swelling Skin-bruising  Neurological Examination   Mental Status: Alert, oriented, thought content appropriate.  Speech fluent without evidence of aphasia.  Able to follow 3 step commands without difficulty. Cranial Nerves: II: Discs flat bilaterally; decrease in left peripheral field, pupils equal, round, reactive to light and accommodation III,IV, VI: ptosis not present, extra-ocular motions intact bilaterally V,VII: smile symmetric, facial light touch sensation normal bilaterally VIII: hearing normal bilaterally IX,X: gag reflex present XI: bilateral shoulder shrug XII: midline tongue extension Motor: Generalized weakness but able to lift all extremities against gravity.  LLE weaker than RLE and unclear if this is related to edema.   Sensory: Pinprick and light touch decreased in the  LUE and LLE Deep Tendon Reflexes: 2+ in the UEs and absent in the LE's Plantars: Right: mute   Left: mute Cerebellar: Normal finger-to-nose and normal heel-to-shin testing bilaterally Gait: not tested due to safety concerns    Laboratory Studies:   Basic Metabolic Panel: Recent Labs  Lab 11/04/17 0519  NA 139  K 2.8*  CL 109  CO2 22  GLUCOSE 176*  BUN 14  CREATININE 1.06*  CALCIUM 8.0*    Liver Function Tests: Recent Labs  Lab 11/04/17 0519  AST 25  ALT 15  ALKPHOS 106  BILITOT 0.8  PROT 6.2*  ALBUMIN 3.0*   No results for input(s): LIPASE, AMYLASE in the last 168 hours. No results for input(s): AMMONIA in the last 168 hours.  CBC: Recent Labs  Lab 11/04/17 0519  WBC 5.9  NEUTROABS 4.8  HGB 8.0*  HCT  24.7*  MCV 79.9*  PLT 111*    Cardiac Enzymes: No results for input(s): CKTOTAL, CKMB, CKMBINDEX, TROPONINI in the last 168 hours.  BNP: Invalid input(s): POCBNP  CBG: No results for input(s): GLUCAP in the last 168 hours.  Microbiology: Results for orders placed or performed during the hospital encounter of 12/23/14  Surgical pcr screen     Status: None   Collection Time: 12/23/14 11:03 AM  Result Value Ref Range Status   MRSA, PCR NEGATIVE NEGATIVE Final   Staphylococcus aureus NEGATIVE NEGATIVE Final    Comment:        The Xpert SA Assay (FDA approved for NASAL specimens in patients over 81 years of age), is one component of a comprehensive surveillance program.  Test performance has been validated by [facility] for patients greater than or equal to 79 year old. It is not intended to diagnose infection nor to guide or monitor treatment.     Coagulation Studies: Recent Labs    11/04/17 0519  LABPROT 14.8  INR 1.17    Urinalysis:  Recent Labs  Lab 11/04/17 0858  COLORURINE YELLOW*  LABSPEC 1.009  PHURINE 7.0  GLUCOSEU 50*  HGBUR NEGATIVE  BILIRUBINUR NEGATIVE  KETONESUR NEGATIVE  PROTEINUR NEGATIVE  NITRITE NEGATIVE  LEUKOCYTESUR NEGATIVE    Lipid Panel:  No results found for: CHOL, TRIG, HDL, CHOLHDL, VLDL, LDLCALC  HgbA1C:  Lab Results  Component Value Date   HGBA1C 5.4 11/04/2017    Urine Drug Screen:  No results found for: LABOPIA, COCAINSCRNUR, LABBENZ, AMPHETMU, THCU, LABBARB  Alcohol Level: No results for input(s): ETH in the last 168 hours.  Other results: EKG: sinus tachycardia at 126 bpm.  Imaging: Ct Head Wo Contrast  Result Date: 11/04/2017 CLINICAL DATA:  Seizure and altered mental status EXAM: CT HEAD WITHOUT CONTRAST TECHNIQUE: Contiguous axial images were obtained from the base of the skull through the vertex without intravenous contrast. COMPARISON:  None. FINDINGS: Brain: There is a giant round mass of the posterior  right convexity that measures 6.8 x 6.3 x 6.5 cm. The mass is hyperdense contains multifocal calcification. There is leftward midline shift that measures approximately 7 mm at the level of the foramina [place]. Moderate vasogenic edema in the right parietal white matter. Vascular: No hyperdense vessel or unexpected vascular calcification. Skull: Normal visualized skull base, calvarium and extracranial soft tissues. Sinuses/Orbits: No sinus fluid levels or advanced mucosal thickening. No mastoid effusion. Normal orbits. IMPRESSION: 1. Giant extra-axial mass of the posterior right convexity, most consistent with meningioma. MRI recommended for confirmation and for more complete assessment of adjacent edema. 2. Moderate edema of  the underlying right parietal lobe. 3. 7 mm of leftward midline shift without frank herniation. No hydrocephalus. Electronically Signed   By: Ulyses Jarred M.D.   On: 11/04/2017 06:16     Assessment/Plan: 62 year old female presenting with new onset seizure activity.  Per description with focal onset that later generalized.  Head CT reviewed and shows a large right extra-axial mass with some associated edema and midline shift.  Meningioma on the differential.  Further work up recommended.    Recommendations: 1.  MRI of the brain with and without contrast 2.  Keppra to be changed to po at 750mg  BID 3.  Will hold steroids for now since NSg would like blood sugars controlled before considering surgery.  May need to reconsider if seizures appear difficult to control.   4.  Patient to continue follow up with neurosurgery.    5.  Seizure precautions 6.  Patient unable to drive, operate heavy machinery, perform activities at heights and participate in water activities until release by outpatient physician.  Alexis Goodell, MD Neurology [phone] 11/04/2017, 1:09 PM

## 2017-11-04 NOTE — Progress Notes (Signed)
Called to patient's room by husband due to him being concerned that patient is still sleeping and doesn't know who he is upon waking.  This nurse in to assess patient.  Patient woke and was able to tell me who her husband and sister were when I pointed to them and asked.  VS are stable at this time and patient is now back asleep.  Patient did receive IV ativan from previous RN.  Explained to husband that she is probably still drowsy from ativan. He verbalized understanding and is leaving to go home for a shower and will return. Patient's sister at bedside.  Clarise Cruz, RN

## 2017-11-04 NOTE — Progress Notes (Signed)
Chaplain delivered an AD per an OR. Pt was asleep. Spouse took AD and will review and notify nurse when ready.    11/04/17 1200  Clinical Encounter Type  Visited With Patient and family together  Visit Type Initial  Referral From Nurse  Spiritual Encounters  Spiritual Needs Brochure

## 2017-11-04 NOTE — Consult Note (Signed)
Neurosurgery-New Consultation Evaluation 11/04/2017 Beth Kaufman [id number]  Identifying Statement: Beth Kaufman is a 62 y.o. female from [place] [address] with meningioma  Physician Requesting Consultation: [facility] ED  History of Present Illness: Beth Kaufman is here with a first time seizure where she had loss of consciousness and full body shaking.  She has had years of left arm tremor but denies any prior seizures. She states she had some left sided weakness after seizure. This has improved. She has been placed on Keppra. She has also noticed some decreased vision on the left. She denies any other recent symptoms of weakness or mental status changes. She does have history of some numbness on the left side.   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 She is being treated with chemotherapy and responding. She is on steroids and has noted some easy bruising in her arms.    Past Medical History:  Past Medical History:  Diagnosis Date  . Anemia   . Depression   . Diabetes mellitus without complication ([place])   . Difficulty swallowing pills   . Dizziness   . GERD (gastroesophageal reflux disease)   . Hemorrhoids   . Hypertension   . Neuromuscular disorder ([place])    peripheral neuropathy  . Peritoneal carcinomatosis ([place])   . PONV (postoperative nausea and vomiting)   . Shortness of breath dyspnea    with exertion  . Sleep apnea    BiPAP  . Ulcer of anus    treated at wound center  . Urinary frequency     Social History: Social History   Socioeconomic History  . Marital status: Married    Spouse name: Not on file  . Number of children: Not on file  . Years of education: Not on file  . Highest education level: Not on file     Family History: Husband has had multiple craniotomies for tumor  Review of Systems:  Review of Systems - General ROS: Negative Psychological ROS: Negative Ophthalmic ROS: Positive for vision loss ENT ROS:  Negative Hematological and Lymphatic ROS: Negative  Endocrine ROS: Negative Respiratory ROS: Negative Cardiovascular ROS: Negative Gastrointestinal ROS: Negative Genito-Urinary ROS: Negative Musculoskeletal ROS: Negative Neurological ROS: Positive for seizure, weakness Dermatological ROS: Negative  Physical Exam: BP 121/62 (BP Location: Left Arm)   Pulse (!) 108   Temp 99 F (37.2 C) (Oral)   Resp 18   Ht 5\' 5"  (1.651 m)   Wt 91.3 kg (201 lb 4.8 oz)   SpO2 98%   BMI 33.50 kg/m  Body mass index is 33.5 kg/m. Body surface area is 2.05 meters squared. General appearance: Alert, cooperative, in no acute distress Head: Normocephalic, atraumatic Eyes: Normal, EOM intact Oropharynx: Moist without lesions Ext: No edema in LE bilaterally, noted contusions on bilateral arms  Neurologic exam:  Mental status: alertness: alert, orientation: person, place, time, affect: normal Speech: fluent and clear, naming and repetition intact Cranial nerves:  II: Visual field decreased on left in periphery III/IV/VI: extra-ocular motions intact bilaterally V/VII:no evidence of facial droop or weakness  VIII: hearing normal XI: trapezius strength symmetric XII: tongue strength symmetric  Motor:strength symmetric 5/5, normal muscle mass and tone in all extremities and no pronator drift Gait: not tested given in bed  Laboratory: Results for orders placed or performed during the hospital encounter of 11/04/17  CBC WITH DIFFERENTIAL  Result Value Ref Range   WBC 5.9 3.6 - 11.0 K/uL   RBC 3.09 (L) 3.80 -  5.20 MIL/uL   Hemoglobin 8.0 (L) 12.0 - 16.0 g/dL   HCT 24.7 (L) 35.0 - 47.0 %   MCV 79.9 (L) 80.0 - 100.0 fL   MCH 25.7 (L) 26.0 - 34.0 pg   MCHC 32.2 32.0 - 36.0 g/dL   RDW 22.2 (H) 11.5 - 14.5 %   Platelets 111 (L) 150 - 440 K/uL   Neutrophils Relative % 82 %   Neutro Abs 4.8 1.4 - 6.5 K/uL   Lymphocytes Relative 8 %   Lymphs Abs 0.5 (L) 1.0 - 3.6 K/uL   Monocytes Relative 9 %    Monocytes Absolute 0.5 0.2 - 0.9 K/uL   Eosinophils Relative 1 %   Eosinophils Absolute 0.1 0 - 0.7 K/uL   Basophils Relative 0 %   Basophils Absolute 0.0 0 - 0.1 K/uL  Comprehensive metabolic panel  Result Value Ref Range   Sodium 139 135 - 145 mmol/L   Potassium 2.8 (L) 3.5 - 5.1 mmol/L   Chloride 109 101 - 111 mmol/L   CO2 22 22 - 32 mmol/L   Glucose, Bld 176 (H) 65 - 99 mg/dL   BUN 14 6 - 20 mg/dL   Creatinine, Ser 1.06 (H) 0.44 - 1.00 mg/dL   Calcium 8.0 (L) 8.9 - 10.3 mg/dL   Total Protein 6.2 (L) 6.5 - 8.1 g/dL   Albumin 3.0 (L) 3.5 - 5.0 g/dL   AST 25 15 - 41 U/L   ALT 15 14 - 54 U/L   Alkaline Phosphatase 106 38 - 126 U/L   Total Bilirubin 0.8 0.3 - 1.2 mg/dL   GFR calc non Af Amer 55 (L) >60 mL/min   GFR calc Af Amer >60 >60 mL/min   Anion gap 8 5 - 15  Urinalysis, Complete w Microscopic  Result Value Ref Range   Color, Urine YELLOW (A) YELLOW   APPearance CLEAR (A) CLEAR   Specific Gravity, Urine 1.009 1.005 - 1.030   pH 7.0 5.0 - 8.0   Glucose, UA 50 (A) NEGATIVE mg/dL   Hgb urine dipstick NEGATIVE NEGATIVE   Bilirubin Urine NEGATIVE NEGATIVE   Ketones, ur NEGATIVE NEGATIVE mg/dL   Protein, ur NEGATIVE NEGATIVE mg/dL   Nitrite NEGATIVE NEGATIVE   Leukocytes, UA NEGATIVE NEGATIVE   RBC / HPF NONE SEEN 0 - 5 RBC/hpf   WBC, UA 0-5 0 - 5 WBC/hpf   Bacteria, UA NONE SEEN NONE SEEN   Squamous Epithelial / LPF NONE SEEN NONE SEEN   Mucus PRESENT   Protime-INR  Result Value Ref Range   Prothrombin Time 14.8 11.4 - 15.2 seconds   INR 1.17   TSH  Result Value Ref Range   TSH 3.817 0.350 - 4.500 uIU/mL  Hemoglobin A1c  Result Value Ref Range   Hgb A1c MFr Bld 5.4 4.8 - 5.6 %   Mean Plasma Glucose 108.28 mg/dL   I personally reviewed labs  Imaging: CT head: 1. Giant extra-axial mass of the posterior right convexity, most consistent with meningioma. MRI recommended for confirmation and for more complete assessment of adjacent edema. 2. Moderate edema of the  underlying right parietal lobe. 3. 7 mm of leftward midline shift without frank herniation. No hydrocephalus.   Impression/Plan:  Beth Kaufman is here for evaluation of a seizure and found to have a large left sided meningioma. I have discussed this is likely a benign finding with her and her family. I agree with continuing Keppra. We discussed role of surgery and I think this is  too large for radiation. Given the size and seizure, it is reasonable to consider resection, however she is at increased risk of complications given cancer treatment and low platelet/RBC count. We talked about possible need for embolization given bleeding risk. I am also concerned about infection. I do not think her vision would improve but surgery would be aimed at it not getting worse. We will coordinate with her oncologist. There is no urgent surgery needed. Can consider performing electively as outpatient   1.  Diagnosis: Large right parietal meningioma  2.  Plan - Continue keppra - MRI brain w/wo recommended - Can follow up as outpatient to discuss surgery, would need to be performed at [facility]

## 2017-11-04 NOTE — Progress Notes (Signed)
Verbal order from Dr. Darvin Neighbours to discontinue telemetry monitor and continuous pulse ox.  Beth Cruz, RN

## 2017-11-04 NOTE — H&P (Signed)
Beth Kaufman is an 62 y.o. female.   Chief Complaint: Seizure HPI: The patient with past medical history of hypertension, sleep apnea, diabetes and stage IV appendiceal cancer resents to the emergency department the EMS after having an apparent seizure.  The patient reportedly had a tonic-clonic seizure that stopped on its own.  The patient was predictably weak afterward and has been for some time given her comorbidities.  CT of her head showed a large intracranial mass.  [facility] was consulted who felt as if the mass was likely a meningioma which could be operated on here which prompted the emergency department staff to call the hospitalist service for admission.  Past Medical History:  Diagnosis Date  . Anemia   . Depression   . Diabetes mellitus without complication ([place])   . Difficulty swallowing pills   . Dizziness   . GERD (gastroesophageal reflux disease)   . Hemorrhoids   . Hypertension   . Neuromuscular disorder ([place])    peripheral neuropathy  . Peritoneal carcinomatosis ([place])   . PONV (postoperative nausea and vomiting)   . Shortness of breath dyspnea    with exertion  . Sleep apnea    BiPAP  . Ulcer of anus    treated at wound center  . Urinary frequency     Past Surgical History:  Procedure Laterality Date  . ABDOMINAL HYSTERECTOMY    . BACK SURGERY  1993  . broken ankle Right 1996   screws and plates  . FRACTURE SURGERY Right    broken ankle  . HIP FRACTURE SURGERY Left    partial replacement  . LUMBAR LAMINECTOMY/DECOMPRESSION MICRODISCECTOMY N/A 01/02/2015   Procedure: LUMBAR LAMINECTOMY/DECOMPRESSION MICRODISCECTOMY 2 LEVELS;  Surgeon: [place] Pies, MD;  Location: [facility];  Service: Neurosurgery;  Laterality: N/A;  L23 L34 laminectomies    No family history on file. Social History:  reports that she quit smoking about 23 years ago. Her smoking use included cigarettes. She has a 20.00 pack-year smoking history. She has never used smokeless  tobacco. She reports that she does not drink alcohol or use drugs.  Allergies:  Allergies  Allergen Reactions  . Morphine Nausea And Vomiting and Rash  . Terbinafine Rash     (Not in a hospital admission)  Results for orders placed or performed during the hospital encounter of 11/04/17 (from the past 48 hour(s))  CBC WITH DIFFERENTIAL     Status: Abnormal   Collection Time: 11/04/17  5:19 AM  Result Value Ref Range   WBC 5.9 3.6 - 11.0 K/uL   RBC 3.09 (L) 3.80 - 5.20 MIL/uL   Hemoglobin 8.0 (L) 12.0 - 16.0 g/dL   HCT 24.7 (L) 35.0 - 47.0 %   MCV 79.9 (L) 80.0 - 100.0 fL   MCH 25.7 (L) 26.0 - 34.0 pg   MCHC 32.2 32.0 - 36.0 g/dL   RDW 22.2 (H) 11.5 - 14.5 %   Platelets 111 (L) 150 - 440 K/uL   Neutrophils Relative % 82 %   Neutro Abs 4.8 1.4 - 6.5 K/uL   Lymphocytes Relative 8 %   Lymphs Abs 0.5 (L) 1.0 - 3.6 K/uL   Monocytes Relative 9 %   Monocytes Absolute 0.5 0.2 - 0.9 K/uL   Eosinophils Relative 1 %   Eosinophils Absolute 0.1 0 - 0.7 K/uL   Basophils Relative 0 %   Basophils Absolute 0.0 0 - 0.1 K/uL    Comment: Performed at [facility], [address]  [place], [place] [address]  Comprehensive metabolic panel     Status: Abnormal   Collection Time: 11/04/17  5:19 AM  Result Value Ref Range   Sodium 139 135 - 145 mmol/L   Potassium 2.8 (L) 3.5 - 5.1 mmol/L   Chloride 109 101 - 111 mmol/L   CO2 22 22 - 32 mmol/L   Glucose, Bld 176 (H) 65 - 99 mg/dL   BUN 14 6 - 20 mg/dL   Creatinine, Ser 1.06 (H) 0.44 - 1.00 mg/dL   Calcium 8.0 (L) 8.9 - 10.3 mg/dL   Total Protein 6.2 (L) 6.5 - 8.1 g/dL   Albumin 3.0 (L) 3.5 - 5.0 g/dL   AST 25 15 - 41 U/L   ALT 15 14 - 54 U/L   Alkaline Phosphatase 106 38 - 126 U/L   Total Bilirubin 0.8 0.3 - 1.2 mg/dL   GFR calc non Af Amer 55 (L) >60 mL/min   GFR calc Af Amer >60 >60 mL/min    Comment: (NOTE) The eGFR has been calculated using the CKD EPI equation. This calculation has not been validated in all clinical  situations. eGFR's persistently <60 mL/min signify possible Chronic Kidney Disease.    Anion gap 8 5 - 15    Comment: Performed at [facility], [address] [place], [place] [address]  Protime-INR     Status: None   Collection Time: 11/04/17  5:19 AM  Result Value Ref Range   Prothrombin Time 14.8 11.4 - 15.2 seconds   INR 1.17     Comment: Performed at [facility], [address] [place], [place] [address]   Ct Head Wo Contrast  Result Date: 11/04/2017 CLINICAL DATA:  Seizure and altered mental status EXAM: CT HEAD WITHOUT CONTRAST TECHNIQUE: Contiguous axial images were obtained from the base of the skull through the vertex without intravenous contrast. COMPARISON:  None. FINDINGS: Brain: There is a giant round mass of the posterior right convexity that measures 6.8 x 6.3 x 6.5 cm. The mass is hyperdense contains multifocal calcification. There is leftward midline shift that measures approximately 7 mm at the level of the foramina [place]. Moderate vasogenic edema in the right parietal white matter. Vascular: No hyperdense vessel or unexpected vascular calcification. Skull: Normal visualized skull base, calvarium and extracranial soft tissues. Sinuses/Orbits: No sinus fluid levels or advanced mucosal thickening. No mastoid effusion. Normal orbits. IMPRESSION: 1. Giant extra-axial mass of the posterior right convexity, most consistent with meningioma. MRI recommended for confirmation and for more complete assessment of adjacent edema. 2. Moderate edema of the underlying right parietal lobe. 3. 7 mm of leftward midline shift without frank herniation. No hydrocephalus. Electronically Signed   By: Ulyses Jarred M.D.   On: 11/04/2017 06:16    Review of Systems  Constitutional: Negative for chills and fever.  HENT: Negative for sore throat and tinnitus.   Eyes: Negative for blurred vision and redness.  Respiratory: Negative for cough and shortness of breath.    Cardiovascular: Negative for chest pain, palpitations, orthopnea and PND.  Gastrointestinal: Negative for abdominal pain, diarrhea, nausea and vomiting.  Genitourinary: Negative for dysuria, frequency and urgency.  Musculoskeletal: Negative for joint pain and myalgias.  Skin: Negative for rash.       No lesions  Neurological: Positive for seizures and weakness. Negative for speech change and focal weakness.  Endo/Heme/Allergies: Does not bruise/bleed easily.       No temperature intolerance  Psychiatric/Behavioral: Negative for depression and suicidal ideas.  Blood pressure 126/72, pulse (!) 117, temperature 98.6 F (37 C), resp. rate 20, SpO2 98 %. Physical Exam  Vitals reviewed. Constitutional: She is oriented to person, place, and time. She appears well-developed and well-nourished. No distress.  HENT:  Head: Normocephalic and atraumatic.  Mouth/Throat: Oropharynx is clear and moist.  Eyes: Pupils are equal, round, and reactive to light. Conjunctivae and EOM are normal. No scleral icterus.  Neck: Normal range of motion. Neck supple. No JVD present. No tracheal deviation present. No thyromegaly present.  Cardiovascular: Normal rate, regular rhythm and normal heart sounds. Exam reveals no gallop and no friction rub.  No murmur heard. Respiratory: Effort normal and breath sounds normal.  GI: Soft. Bowel sounds are normal. She exhibits no distension. There is no tenderness.  Genitourinary:  Genitourinary Comments: Deferred  Musculoskeletal: Normal range of motion. She exhibits no edema.  Lymphadenopathy:    She has no cervical adenopathy.  Neurological: She is alert and oriented to person, place, and time. No cranial nerve deficit. She exhibits normal muscle tone.  Skin: Skin is warm and dry. Ecchymosis and petechiae noted.  Psychiatric: She has a normal mood and affect. Her behavior is normal. Judgment and thought content normal.     Assessment/Plan This is a 62 year old  female admitted for brain mass. 1.  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  Decadron IV given in the emergency department 2.  Seizure: New onset; secondary to brain lesion.  Loaded with Keppra.  Continue anti-epileptic medication per neurology recommendations 3.  Diabetes mellitus type 2: She is home medication list is not yet complete.  When meds able to be verified by patient's home pharmacy will continue basal insulin if necessary in addition to sliding scale. 4.  Appendiceal cancer: With peritoneal carcinomatosis and lung nodules; currently undergoing FOLFOX treatment.  Consult oncology for treatment regimen while admitted for neurosurgery 5.  DVT prophylaxis: SCDs 6.  GI prophylaxis: None The patient is a full code.  Time spent on admission orders and patient care approximately 45 minutes  Harrie Foreman, MD 11/04/2017, 7:25 AM

## 2017-11-04 NOTE — Progress Notes (Signed)
Discussed with Dr. Lacinda Axon of neurosurgery.  Patient will need antiseizure medications.  Will check MRI of the brain.  If no further seizures likely discharge later today follow-up with neurosurgery as outpatient.  Consulted neurology.  Start Keppra 750 twice daily.

## 2017-11-04 NOTE — ED Notes (Signed)
Patient attempted to provide UA sample but defecated in the toilet hat.

## 2017-11-04 NOTE — ED Provider Notes (Signed)
[facility] Emergency Department Provider Note  ____________________________________________   First MD Initiated Contact with Patient 11/04/17 0510     (approximate)  I have reviewed the triage vital signs and the nursing notes.   HISTORY  Chief Complaint Weakness and Possible Seizure  History is limited as the patient is amnestic to the actual event  HPI Beth Kaufman is a 62 y.o. female who comes to the emergency department by EMS after a possible seizure this evening.  She was sitting on the couch when according to the patient's husband her eyes rolled back in her head and her left arm became limp and dropped.  When EMS arrived she had normal strength.  She was tachycardic to the 130s.  Normal blood sugar.  The patient has a past medical history of stage IV appendiceal cancer and is receiving frequent chemotherapy.  She has not had any surgery.  She currently feels well and has no complaints.  Her symptoms seem to come on suddenly.  They passed quickly on their own.  Nothing in particular seemed to make them better or worse.  Past Medical History:  Diagnosis Date  . Anemia   . Depression   . Diabetes mellitus without complication ([place])   . Difficulty swallowing pills   . Dizziness   . GERD (gastroesophageal reflux disease)   . Hemorrhoids   . Hypertension   . Neuromuscular disorder ([place])    peripheral neuropathy  . PONV (postoperative nausea and vomiting)   . Shortness of breath dyspnea    with exertion  . Sleep apnea    BiPAP  . Ulcer of anus    treated at wound center  . Urinary frequency     Patient Active Problem List   Diagnosis Date Noted  . Lumbar stenosis with neurogenic claudication 01/02/2015    Past Surgical History:  Procedure Laterality Date  . ABDOMINAL HYSTERECTOMY    . BACK SURGERY  1993  . broken ankle Right 1996   screws and plates  . FRACTURE SURGERY Right    broken ankle  . HIP FRACTURE SURGERY Left    partial  replacement  . LUMBAR LAMINECTOMY/DECOMPRESSION MICRODISCECTOMY N/A 01/02/2015   Procedure: LUMBAR LAMINECTOMY/DECOMPRESSION MICRODISCECTOMY 2 LEVELS;  Surgeon: [place] Pies, MD;  Location: [facility];  Service: Neurosurgery;  Laterality: N/A;  L23 L34 laminectomies    Prior to Admission medications   Medication Sig Start Date End Date Taking? Authorizing Provider  bismuth subsalicylate (PEPTO BISMOL) 262 MG/15ML suspension Take 30 mLs by mouth every 6 (six) hours as needed for indigestion.    [provider]  diazepam (VALIUM) 5 MG tablet Take 1 tablet (5 mg total) by mouth every 6 (six) hours as needed for muscle spasms. 01/03/15   [place] Pies, MD  docusate sodium (COLACE) 100 MG capsule Take 1 capsule (100 mg total) by mouth 2 (two) times daily. 01/03/15   [place] Pies, MD  glipiZIDE (GLUCOTROL) 10 MG tablet Take 10 mg by mouth 2 (two) times daily before a meal.     [provider]  oxyCODONE-acetaminophen (PERCOCET) 10-325 MG per tablet Take 1 tablet by mouth every 4 (four) hours as needed for pain. 01/03/15   [place] Pies, MD  Sennosides-Docusate Sodium (SENNA PLUS PO) Take 1 tablet by mouth daily as needed.    [provider]  sertraline (ZOLOFT) 100 MG tablet TAKE ONE TABLET BY MOUTH EVERY MORNING 02/14/14   [provider]  Simethicone (MYLANTA GAS PO) Take 2  tablets by mouth daily as needed.    [provider]  temazepam (RESTORIL) 30 MG capsule Take 30 mg by mouth at bedtime as needed for sleep.     [provider]  triamterene-hydrochlorothiazide (DYAZIDE) 37.5-25 MG per capsule Take 1 capsule by mouth daily.    [provider]    Allergies Morphine and Terbinafine  No family history on file.  Social History Social History   Tobacco Use  . Smoking status: Former Smoker    Packs/day: 1.00    Years: 20.00    Pack years: 20.00    Types: Cigarettes    Last attempt to quit: 08/12/1994    Years since  quitting: 23.2  . Smokeless tobacco: Never Used  Substance Use Topics  . Alcohol use: No    Alcohol/week: 0.0 oz    Comment: rarely  . Drug use: No    Review of Systems Constitutional: No fever/chills Eyes: No visual changes. ENT: No sore throat. Cardiovascular: Denies chest pain. Respiratory: Denies shortness of breath. Gastrointestinal: No abdominal pain.  No nausea, no vomiting.  No diarrhea.  No constipation. Genitourinary: Negative for dysuria. Musculoskeletal: Negative for back pain. Skin: Negative for rash. Neurological: Positive for focal weakness   ____________________________________________   PHYSICAL EXAM:  VITAL SIGNS: ED Triage Vitals  Enc Vitals Group     BP      Pulse      Resp      Temp      Temp src      SpO2      Weight      Height      Head Circumference      Peak Flow      Pain Score      Pain Loc      Pain Edu?      Excl. in [place]?     Constitutional: Pleasant cooperative no acute distress Eyes: PERRL EOMI. pupils are mid range and brisk Head: Atraumatic. Nose: No congestion/rhinnorhea. Mouth/Throat: Bites to the right inner lip Neck: No stridor.   Cardiovascular: Tachycardic rate, regular rhythm. Grossly normal heart sounds.  Good peripheral circulation. Respiratory: Normal respiratory effort.  No retractions. Lungs CTAB and moving good air Gastrointestinal: Soft nontender Musculoskeletal: No lower extremity edema   Neurologic:  Normal speech and language.  Cranial nerves II through XII intact No pronator drift 5 out of 5 grips biceps triceps plantar flexion dorsiflexion Skin:  Skin is warm, dry and intact. No rash noted. Psychiatric: Mood and affect are normal. Speech and behavior are normal.    ____________________________________________   DIFFERENTIAL includes but not limited to  Stroke, TIA, seizure, intracerebral hemorrhage, brain metastases ____________________________________________   LABS (all labs ordered are  listed, but only abnormal results are displayed)  Labs Reviewed  CBC WITH DIFFERENTIAL/PLATELET - Abnormal; Notable for the following components:      Result Value   RBC 3.09 (*)    Hemoglobin 8.0 (*)    HCT 24.7 (*)    MCV 79.9 (*)    MCH 25.7 (*)    RDW 22.2 (*)    Platelets 111 (*)    Lymphs Abs 0.5 (*)    All other components within normal limits  COMPREHENSIVE METABOLIC PANEL - Abnormal; Notable for the following components:   Potassium 2.8 (*)    Glucose, Bld 176 (*)    Creatinine, Ser 1.06 (*)    Calcium 8.0 (*)    Total Protein 6.2 (*)    Albumin 3.0 (*)  GFR calc non Af Amer 55 (*)    All other components within normal limits  PROTIME-INR  URINALYSIS, COMPLETE (UACMP) WITH MICROSCOPIC    Lab work reviewed by me shows a low hemoglobin consistent with the patient's previous __________________________________________  EKG  ED ECG REPORT I, Darel Hong, the attending physician, personally viewed and interpreted this ECG.  Date: 11/04/2017 EKG Time:  Rate: 127 Rhythm: Sinus tachycardia QRS Axis: Left axis Intervals: normal ST/T Wave abnormalities: normal Narrative Interpretation: no evidence of acute ischemia  ____________________________________________  RADIOLOGY  Head CT reviewed by me shows 7 cm left-sided extra-axial mass with midline shift ____________________________________________   PROCEDURES  Procedure(s) performed: no  .Critical Care Performed by: Darel Hong, MD Authorized by: Darel Hong, MD   Critical care provider statement:    Critical care time (minutes):  35   Critical care time was exclusive of:  Separately billable procedures and treating other patients   Critical care was necessary to treat or prevent imminent or life-threatening deterioration of the following conditions:  CNS failure or compromise   Critical care was time spent personally by me on the following activities:  Development of treatment plan with  patient or surrogate, discussions with consultants, evaluation of patient's response to treatment, examination of patient, obtaining history from patient or surrogate, ordering and performing treatments and interventions, ordering and review of laboratory studies, ordering and review of radiographic studies, pulse oximetry, re-evaluation of patient's condition and review of old charts    Critical Care performed: Yes  Observation: no ____________________________________________   INITIAL IMPRESSION / ASSESSMENT AND PLAN / ED COURSE  Pertinent labs & imaging results that were available during my care of the patient were reviewed by me and considered in my medical decision making (see chart for details).  The patient arrives neurologically intact with what sounds like a first lifetime seizure.  She does have bites to the inside of her mouth which are consistent.  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  IV Keppra, labs, and head CT now.  I was called by radiology that the patient has a 7 cm mass which appears to be extra-axial and likely a meningioma.  She does have some midline shift.  There is some edema so we will give a dose of dexamethasone and reach out to neurosurgery now.     ----------------------------------------- 6:28 AM on 11/04/2017 -----------------------------------------  I spoke with Dr. Lacinda Axon on-call for neurosurgery who will kindly come evaluate the patient.  At this point given her midline shift, persistent tachycardia, and new seizure I do believe the patient requires inpatient admission for serial neurological examinations and continued antiepileptics.  I discussed with the patient and family who verbalized understanding and agreement with the plan.  I then discussed with Dr. Marcille Blanco who has graciously agreed to admit the patient to his service. 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     Darel Hong, MD 11/04/17 2208

## 2017-11-04 NOTE — ED Notes (Signed)
Patient r/f CT. 

## 2017-11-04 NOTE — Discharge Instructions (Signed)
Resume diet and activity as before.  Do not drive or operate heavy machinery until cleared to do so by outpatient neurologist.

## 2017-11-04 NOTE — ED Triage Notes (Signed)
Patient arrives from home and EMS reported possible seizure reported by family, unknown seizure time.  Pt has CA hx.  She is AOx4 at this time.  Family is in lobby and will be let back to give better medical hx.

## 2017-11-05 DIAGNOSIS — D330 Benign neoplasm of brain, supratentorial: Secondary | ICD-10-CM

## 2017-11-05 MED ORDER — LEVETIRACETAM 750 MG PO TABS: 750.0000 mg | ORAL_TABLET | Freq: Two times a day (BID) | ORAL | 0 refills | Status: AC

## 2017-11-05 MED ORDER — HEPARIN SOD (PORK) LOCK FLUSH 100 UNIT/ML IV SOLN
500.0000 [IU] | Freq: Once | INTRAVENOUS | Status: AC
  Administered 2017-11-05: 14:00:00 500 [IU] via INTRAVENOUS
  Filled 2017-11-05: qty 5

## 2017-11-05 MED ORDER — PHENOL 1.4 % MT LIQD
1.0000 | OROMUCOSAL | Status: DC | PRN
  Filled 2017-11-05: qty 177

## 2017-11-05 NOTE — Progress Notes (Addendum)
Subjective: No further seizures noted.  Patient tolerating Keppra  Objective: Current vital signs: BP 104/70 (BP Location: Left Arm)   Pulse 78   Temp 97.7 F (36.5 C) (Oral)   Resp 18   Ht 5\' 5"  (1.651 m)   Wt 91.4 kg (201 lb 6.4 oz)   SpO2 99%   BMI 33.51 kg/m  Vital signs in last 24 hours: Temp:  [97.7 F (36.5 C)-97.8 F (36.6 C)] 97.7 F (36.5 C) (03/26 1953) Pulse Rate:  [78-85] 78 (03/26 1953) Resp:  [18] 18 (03/26 1953) BP: (104-114)/(70-72) 104/70 (03/26 1953) SpO2:  [99 %] 99 % (03/26 1953) Weight:  [91.4 kg (201 lb 6.4 oz)-93.3 kg (205 lb 11.2 oz)] 91.4 kg (201 lb 6.4 oz) (03/27 0500)  Intake/Output from previous day: 03/26 0701 - 03/27 0700 In: 2166.7 [P.O.:240; I.V.:1926.7] Out: -  Intake/Output this shift: Total I/O In: -  Out: 1 [Urine:1] Nutritional status: Seizure precautions Diet regular Room service appropriate? Yes; Fluid consistency: Thin  Neurologic Exam: Mental Status: Alert, oriented, thought content appropriate.  Speech fluent without evidence of aphasia.  Able to follow 3 step commands without difficulty. Cranial Nerves: II: Discs flat bilaterally; decrease in left peripheral field, pupils equal, round, reactive to light and accommodation III,IV, VI: ptosis not present, extra-ocular motions intact bilaterally V,VII: smile symmetric, facial light touch sensation normal bilaterally VIII: hearing normal bilaterally IX,X: gag reflex present XI: bilateral shoulder shrug XII: midline tongue extension Motor: Generalized weakness but able to lift all extremities against gravity.  LLE weaker than RLE and unclear if this is related to edema.     Lab Results: Basic Metabolic Panel: Recent Labs  Lab 11/04/17 0519  NA 139  K 2.8*  CL 109  CO2 22  GLUCOSE 176*  BUN 14  CREATININE 1.06*  CALCIUM 8.0*    Liver Function Tests: Recent Labs  Lab 11/04/17 0519  AST 25  ALT 15  ALKPHOS 106  BILITOT 0.8  PROT 6.2*  ALBUMIN 3.0*   No  results for input(s): LIPASE, AMYLASE in the last 168 hours. No results for input(s): AMMONIA in the last 168 hours.  CBC: Recent Labs  Lab 11/04/17 0519  WBC 5.9  NEUTROABS 4.8  HGB 8.0*  HCT 24.7*  MCV 79.9*  PLT 111*    Cardiac Enzymes: No results for input(s): CKTOTAL, CKMB, CKMBINDEX, TROPONINI in the last 168 hours.  Lipid Panel: No results for input(s): CHOL, TRIG, HDL, CHOLHDL, VLDL, LDLCALC in the last 168 hours.  CBG: No results for input(s): GLUCAP in the last 168 hours.  Microbiology: Results for orders placed or performed during the hospital encounter of 12/23/14  Surgical pcr screen     Status: None   Collection Time: 12/23/14 11:03 AM  Result Value Ref Range Status   MRSA, PCR NEGATIVE NEGATIVE Final   Staphylococcus aureus NEGATIVE NEGATIVE Final    Comment:        The Xpert SA Assay (FDA approved for NASAL specimens in patients over 14 years of age), is one component of a comprehensive surveillance program.  Test performance has been validated by [facility] for patients greater than or equal to 43 year old. It is not intended to diagnose infection nor to guide or monitor treatment.     Coagulation Studies: Recent Labs    11/04/17 0519  LABPROT 14.8  INR 1.17    Imaging: Ct Head Wo Contrast  Result Date: 11/04/2017 CLINICAL DATA:  Seizure and altered mental status EXAM: CT  HEAD WITHOUT CONTRAST TECHNIQUE: Contiguous axial images were obtained from the base of the skull through the vertex without intravenous contrast. COMPARISON:  None. FINDINGS: Brain: There is a giant round mass of the posterior right convexity that measures 6.8 x 6.3 x 6.5 cm. The mass is hyperdense contains multifocal calcification. There is leftward midline shift that measures approximately 7 mm at the level of the foramina Monro. Moderate vasogenic edema in the right parietal white matter. Vascular: No hyperdense vessel or unexpected vascular calcification. Skull: Normal  visualized skull base, calvarium and extracranial soft tissues. Sinuses/Orbits: No sinus fluid levels or advanced mucosal thickening. No mastoid effusion. Normal orbits. IMPRESSION: 1. Giant extra-axial mass of the posterior right convexity, most consistent with meningioma. MRI recommended for confirmation and for more complete assessment of adjacent edema. 2. Moderate edema of the underlying right parietal lobe. 3. 7 mm of leftward midline shift without frank herniation. No hydrocephalus. Electronically Signed   By: Ulyses Jarred M.D.   On: 11/04/2017 06:16   Mr Jeri Cos WU Contrast  Result Date: 11/04/2017 CLINICAL DATA:  Seizure.  Large extra-axial mass on CT. EXAM: MRI HEAD WITHOUT AND WITH CONTRAST TECHNIQUE: Multiplanar, multiecho pulse sequences of the brain and surrounding structures were obtained without and with intravenous contrast. CONTRAST:  39mL MULTIHANCE GADOBENATE DIMEGLUMINE 529 MG/ML IV SOLN COMPARISON:  Head CT 11/04/2017 FINDINGS: Multiple sequences are moderately motion degraded. Brain: A large extra-axial mass over the posterior right cerebral convexity in the parieto-occipital region measures 6.5 x 6.4 x 6.9 cm. The mass demonstrates avid, mildly heterogeneous enhancement, mixed T2 hypointensity and scattered hyperintensity, and scattered susceptibility corresponding to calcification on CT. An enhancing dural tail is noted along the falx. There is prominent mass effect on the right parietooccipital lobes as well as on the right lateral ventricle. Milder mass effect is noted on the left occipital lobe, left lateral ventricle, right thalamus, and right midbrain. There is mild surrounding vasogenic edema which extends into the posterior corpus callosum but does not cross midline. Leftward midline shift measures 7 mm at the level of the foramina of Monro. Leftward midline shift posterior to the ventricles at the level of the mass measures 2 cm. There is no significant ventricular dilatation.  There is no acute infarct, intracranial hemorrhage, or extra-axial fluid collection. Cerebral volume is within normal limits for age. No significant chronic white matter disease is seen for age. Vascular: Major intracranial vascular flow voids are preserved. The posterior aspect of the superior sagittal sinus is displaced and compressed by the mass but appears patent above and below it. Skull and upper cervical spine: No suspicious marrow lesion. Sinuses/Orbits: Unremarkable orbits. Clear paranasal sinuses. Large left mastoid effusion without obstructing nasopharyngeal lesion identified. Other: None. IMPRESSION: 1. 6.9 cm extra-axial mass over the posterior right cerebral convexity consistent with meningioma. Associated mass effect and edema as above. 2. Large left mastoid effusion. Electronically Signed   By: Logan Bores M.D.   On: 11/04/2017 15:43    Medications:  I have reviewed the patient's current medications. Scheduled: . docusate sodium  100 mg Oral BID  . heparin lock flush  500 Units Intravenous Once  . levETIRAcetam  750 mg Oral BID    Assessment/Plan: Patient on Keppra.  No further seizures.  Tolerating well.  Neurosurgery has evaluated the patient and will be following on an outpatient basis.  MRI of the brain reviewed and mass consistent with a meningioma.    Recommendations: 1.  Continue Keppra 750mg  BID 2.  Patient unable to drive, operate heavy machinery, perform activities at heights and participate in water activities until release by outpatient physician.   LOS: 1 day   Alexis Goodell, MD Neurology [phone] 11/05/2017  1:24 PM

## 2017-11-05 NOTE — Progress Notes (Signed)
Discussed discharge instructions and medications with patient and her husband. IV removed. All questions addressed. Patient transported home via car by her husband.  Clarise Cruz, RN

## 2017-11-05 NOTE — Care Management Note (Signed)
Case Management Note  Patient Details  Name: Beth Kaufman MRN: [id number] Date of Birth: 1956/05/23  Subjective/Objective:  Admitted to [facility] with the diagnosis of brain mass. Lives with husband, Beth Kaufman [phone]). Prescriptions are filled at [facility] in [place]. Last seen Dr. Sabra Heck 2 weeks ago. Home health (PT) per [facility]. [place] for port access and drain ball. No skilled nursing. No home oxygen. Rolling walker, wheelchair, cane, bipap, compression machine, bedside commode, hospital bed, bath bench, and ramps in the home. Self feed, needs help with dressing and baths. Last fall was 3 months ago. Good appetite.  Discussed montorized  Wheelchair. Discussed getting prescription from primary care physician and going to [employer].    Discharge to home today per Dr. Darvin Neighbours.                Action/Plan: Will update [facility] and [place]   Expected Discharge Date:  11/05/17               Expected Discharge Plan:     In-House Referral:   yes  Discharge planning Services   yes  Post Acute Care Choice:    Choice offered to:     DME Arranged:    DME Agency:     HH Arranged:   Already  [place]:   [place] and [place]  Status of Service:     If discussed at [place] of Stay Meetings, dates discussed:    Additional Comments:  Shelbie Ammons, RN MSN CCM Care Management [phone] 11/05/2017, 10:29 AM

## 2017-11-06 NOTE — Care Management (Signed)
Post discharge note entry: Orders to resume home health sent to [facility] health [phone].

## 2017-11-07 NOTE — Discharge Summary (Signed)
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 Anemia   . Depression   . Diabetes mellitus without complication ([place])   . Difficulty swallowing pills   . Dizziness   . GERD (gastroesophageal reflux disease)   . Hemorrhoids   . Hypertension   . Neuromuscular disorder ([place])    peripheral neuropathy  . Peritoneal carcinomatosis ([place])   . PONV (postoperative nausea and vomiting)   . Shortness of breath dyspnea    with exertion  . Sleep apnea    BiPAP  . Ulcer of anus    treated at wound center  . Urinary frequency      ADMITTING HISTORY  Chief Complaint: Seizure HPI: The patient with past medical history of hypertension, sleep apnea, diabetes and stage IV appendiceal cancer resents to the emergency department the EMS after having an apparent seizure.  The patient reportedly had a tonic-clonic seizure that stopped on its own.  The patient was predictably weak afterward and has been for some time given her comorbidities.  CT of her head showed a large intracranial mass.  [facility] neurosurgery was consulted who felt as if the mass was likely a meningioma which could be operated on here which prompted the emergency department staff to call the hospitalist service for admission. 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  Seen by neurosurgery and neurology.  Discussed with Dr. Lacinda Axon of neurosurgery.  Patient will need extensive surgery and has to be coordinated with [facility]  surgery due to patient's prior history of cancer.  At this time assisted continuing antiseizure medications without any steroids.  Patient will follow up with Dr. Lacinda Axon as outpatient for further surgery evaluation.  Diabetes mellitus.  Sliding scale insulin in the hospital.  Resume home medications at discharge.  Patient has done well without any seizures.  Continue Keppra 750 mg twice daily.  Stable for discharge home. No driving or operating heavy machinery until cleared by outpatient neurologist.  CONSULTS OBTAINED:  Treatment Team:  Alexis Goodell, MD Deetta Perla, MD Cammie Sickle, MD  DRUG ALLERGIES:   Allergies  Allergen Reactions  . Morphine Nausea And Vomiting and Rash  . Terbinafine Rash    DISCHARGE MEDICATIONS:   Allergies as of 11/05/2017      Reactions   Morphine Nausea And Vomiting, Rash   Terbinafine Rash      Medication List    TAKE these medications   dexamethasone 2 MG tablet Commonly known as:  DECADRON Take 2 mg by mouth daily.   diazepam 5 MG tablet Commonly known as:  VALIUM Take 1 tablet (5 mg total) by mouth every 6 (six) hours as needed for muscle spasms.   docusate sodium 100 MG capsule Commonly known as:  COLACE Take 1 capsule (100 mg total) by mouth 2 (two) times daily.   levETIRAcetam 750 MG tablet Commonly known as:  KEPPRA Take 1 tablet (750 mg total) by mouth 2 (two) times daily.   LORazepam  0.5 MG tablet Commonly known as:  ATIVAN Take 0.5 mg by mouth every 8 (eight) hours as needed for anxiety.   magnesium hydroxide 400 MG/5ML suspension Commonly known as:  MILK OF MAGNESIA Take 30 mLs by mouth daily as needed for mild constipation.   mirtazapine 15 MG tablet Commonly known as:  REMERON Take 15 mg by mouth at bedtime.   OLANZapine 5 MG tablet Commonly known as:  ZYPREXA Take 5 mg by mouth at bedtime.   ondansetron 8 MG tablet Commonly known as:  ZOFRAN Take 8 mg by mouth every 8 (eight) hours as needed for  nausea or vomiting.   oxyCODONE 10 mg 12 hr tablet Commonly known as:  OXYCONTIN Take 10 mg by mouth every 12 (twelve) hours.   oxyCODONE 5 MG immediate release tablet Commonly known as:  Oxy IR/ROXICODONE Take 5-10 mg by mouth every 4 (four) hours as needed for severe pain. Up to 6/ day   oxyCODONE-acetaminophen 10-325 MG tablet Commonly known as:  PERCOCET Take 1 tablet by mouth every 4 (four) hours as needed for pain.   pantoprazole 40 MG tablet Commonly known as:  PROTONIX Take 40 mg by mouth daily.   potassium chloride SA 20 MEQ tablet Commonly known as:  K-DUR,KLOR-CON Take 20 mEq by mouth 2 (two) times daily.   sertraline 100 MG tablet Commonly known as:  ZOLOFT TAKE ONE TABLET BY MOUTH EVERY MORNING   triamcinolone 0.025 % cream Commonly known as:  KENALOG Apply 1 application topically 2 (two) times daily as needed.   TUSSIONEX PENNKINETIC ER 10-8 MG/5ML [place] Generic drug:  chlorpheniramine-HYDROcodone Take 5 mLs by mouth every 12 (twelve) hours as needed for cough.       Today   VITAL SIGNS:  Blood pressure 104/70, pulse 78, temperature 97.7 F (36.5 C), temperature source Oral, resp. rate 18, height 5\' 5"  (1.651 m), weight 91.4 kg (201 lb 6.4 oz), SpO2 99 %.  I/O:  No intake or output data in the 24 hours ending 11/07/17 1837  PHYSICAL EXAMINATION:  Physical Exam  GENERAL:  62 y.o.-year-old patient lying in the bed with no acute distress.  LUNGS: Normal breath sounds bilaterally, no wheezing, rales,rhonchi or crepitation. No use of accessory muscles of respiration.  CARDIOVASCULAR: S1, S2 normal. No murmurs, rubs, or gallops.  ABDOMEN: Soft, non-tender, non-distended. Bowel sounds present. No organomegaly or mass.  NEUROLOGIC: Moves all 4 extremities. PSYCHIATRIC: The patient is alert and oriented x 3.  SKIN: No obvious rash, lesion, or ulcer.   DATA REVIEW:   CBC Recent Labs  Lab 11/04/17 0519  WBC 5.9  HGB 8.0*  HCT 24.7*  PLT 111*     Chemistries  Recent Labs  Lab 11/04/17 0519  NA 139  K 2.8*  CL 109  CO2 22  GLUCOSE 176*  BUN 14  CREATININE 1.06*  CALCIUM 8.0*  AST 25  ALT 15  ALKPHOS 106  BILITOT 0.8    Cardiac Enzymes No results for input(s): TROPONINI in the last 168 hours.  Microbiology Results  Results for orders placed or performed during the hospital encounter of 12/23/14  Surgical pcr screen     Status: None   Collection Time: 12/23/14 11:03 AM  Result Value Ref Range Status   MRSA, PCR NEGATIVE NEGATIVE Final   Staphylococcus aureus NEGATIVE NEGATIVE Final    Comment:        The Xpert SA Assay (FDA approved for NASAL specimens in patients over 14 years of age), is  one component of a comprehensive surveillance program.  Test performance has been validated by [facility] for patients greater than or equal to 2 year old. It is not intended to diagnose infection nor to guide or monitor treatment.     RADIOLOGY:  No results found.  Follow up with PCP in 1 week.  Management plans discussed with the patient, family and they are in agreement.  CODE STATUS:  Code Status History    Date Active Date Inactive Code Status Order ID Comments User Context   11/04/2017 0758 11/05/2017 1735 Full Code [id number]  Harrie Foreman, MD ED   01/02/2015 1140 01/03/2015 1212 Full Code [id number]  Newman Pies, MD Inpatient      TOTAL TIME TAKING CARE OF THIS PATIENT ON DAY OF DISCHARGE: more than 30 minutes.   Neita Carp M.D on 11/07/2017 at 6:37 PM  Between 7am to 6pm - Pager - [phone]  After 6pm go to [email or web address] - password [email or web address] [facility]  Office  [phone]  CC: Primary care physician; Rusty Aus, MD  Note: This dictation was prepared with Dragon dictation along with smaller phrase technology. Any transcriptional errors that result from this process are unintentional.

## 2017-11-14 ENCOUNTER — Encounter (HOSPITAL_BASED_OUTPATIENT_CLINIC_OR_DEPARTMENT_OTHER): Payer: BLUE CROSS/BLUE SHIELD

## 2018-03-16 ENCOUNTER — Other Ambulatory Visit: Payer: Self-pay | Admitting: Student

## 2018-03-16 DIAGNOSIS — D329 Benign neoplasm of meninges, unspecified: Secondary | ICD-10-CM

## 2018-04-02 ENCOUNTER — Ambulatory Visit
Admission: RE | Admit: 2018-04-02 | Discharge: 2018-04-02 | Disposition: A | Discharge status: Home / Self Care | Payer: Medicaid Other | Source: Ambulatory Visit | Attending: Student | Admitting: Student

## 2018-04-02 DIAGNOSIS — D329 Benign neoplasm of meninges, unspecified: Secondary | ICD-10-CM | POA: Insufficient documentation

## 2018-04-02 MED ORDER — GADOBENATE DIMEGLUMINE 529 MG/ML IV SOLN
20.0000 mL | Freq: Once | INTRAVENOUS | Status: AC | PRN
  Administered 2018-04-02: 19 mL via INTRAVENOUS

## 2018-08-12 DEATH — deceased

## 2019-12-22 IMAGING — MR MR HEAD WO/W CM
15 of 16 series · 44 of 48 positions shown · IV contrast (multihance)
Comparison: 11/04/2017

CLINICAL DATA: Followup meningioma. Resection November 2017. Patient
is asymptomatic

EXAM:
MRI HEAD WITHOUT AND WITH CONTRAST
TECHNIQUE: Multiplanar, multiecho pulse sequences of the brain and surrounding
structures were obtained without and with intravenous contrast.
CONTRAST:  19mL MULTIHANCE GADOBENATE DIMEGLUMINE 529 MG/ML IV SOLN

[Series 5: ax dwi_tracew · axial · 3.0mm · 0.73mm/px · z∈[-43,+118]mm · 2 of 55 slices shown (1 of 2)]
[im 1/55]
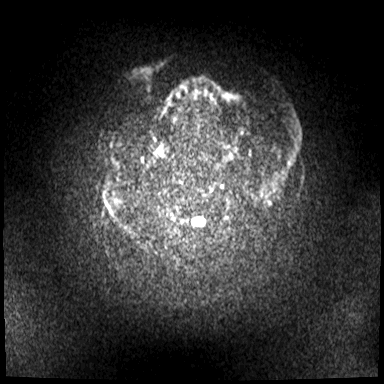
[im 55/55]
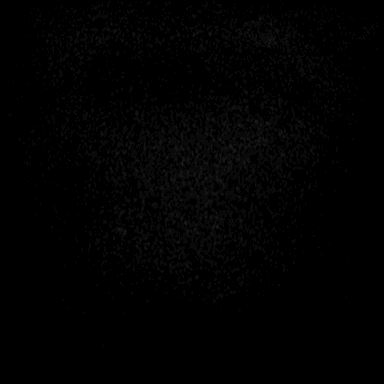

[Series 5: ax dwi_tracew · axial · 3.0mm · 0.73mm/px · z∈[-43,+118]mm · 3 of 55 slices shown (2 of 2)]
[im 1/55]
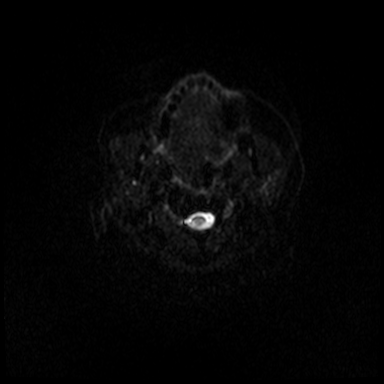
[im 28/55]
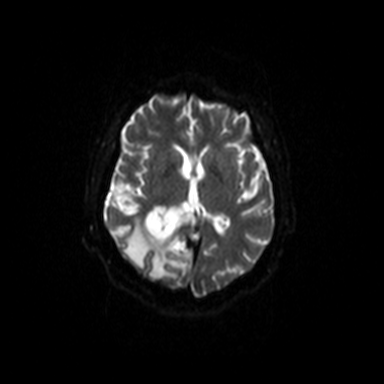
[im 55/55]
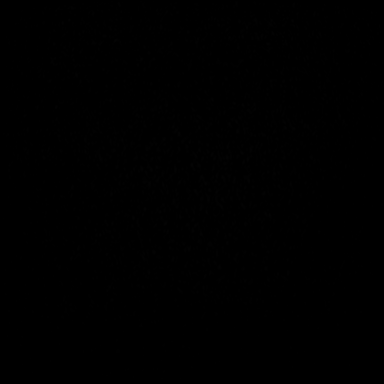

[Series 6: ax dwi_adc · axial · 3.0mm · 0.73mm/px · z∈[-43,+112]mm · 3 of 53 slices shown]
[im 1/53]
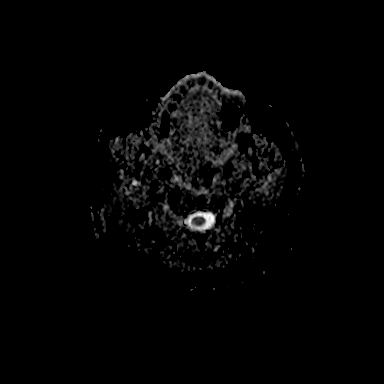
[im 27/53]
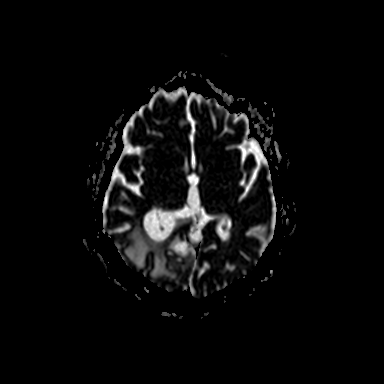
[im 53/53]
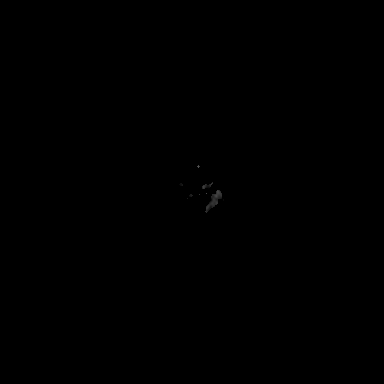

[Series 7: cor dwi_tracew · coronal · 5.0mm · 0.60mm/px · 2 of 39 slices shown (1 of 2)]
[im 1/39]
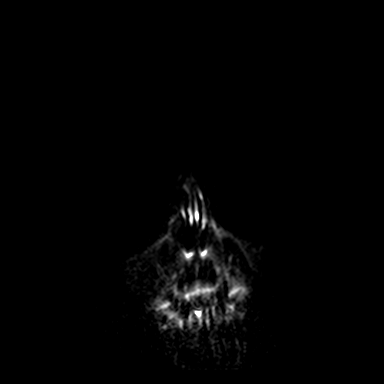
[im 39/39]
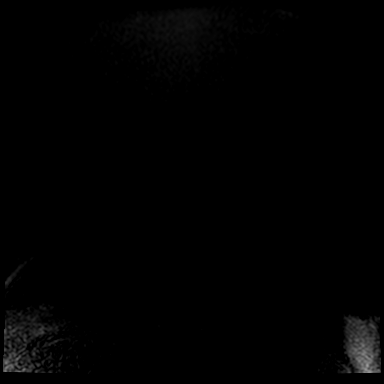

[Series 7: cor dwi_tracew · coronal · 5.0mm · 0.60mm/px · 2 of 39 slices shown (2 of 2)]
[im 1/39]
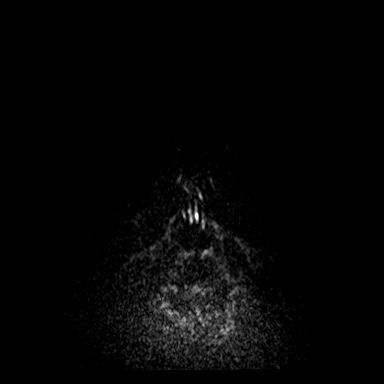
[im 39/39]
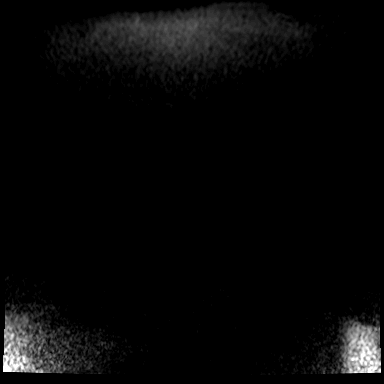

[Series 8: cor dwi_adc · coronal · 5.0mm · 0.60mm/px · 2 of 39 slices shown]
[im 1/39]
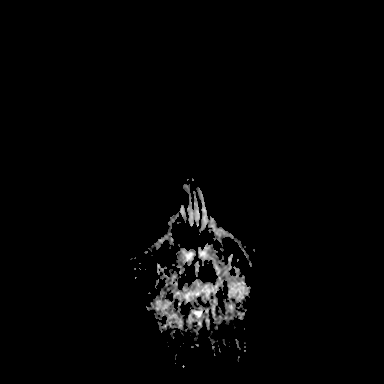
[im 39/39]
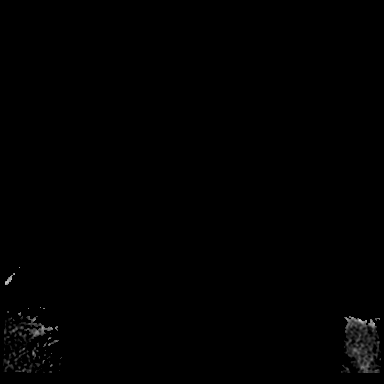

[Series 9: T1 · sagittal · 5.0mm · 0.62mm/px · 1 of 25 slices shown (1 of 2)]
[im 1/25]
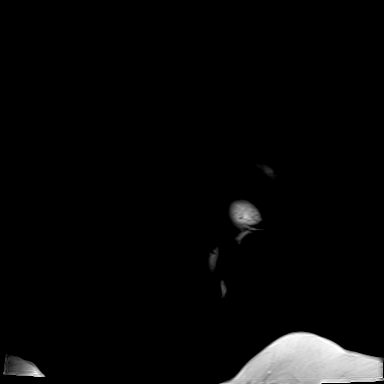

[Series 10: T2 · axial · 5.0mm · 0.53mm/px · 1 of 27 slices shown]
[im 1/27]
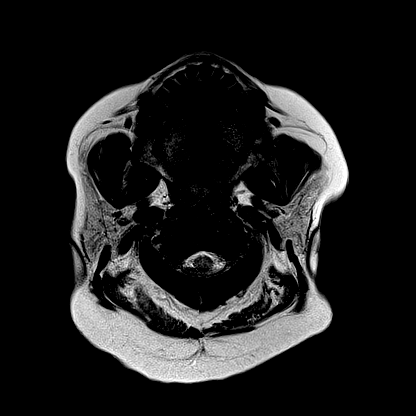

[Series 11: swi_images · axial · 3.0mm · 0.90mm/px · z∈[-50,+113]mm · 3 of 56 slices shown]
[im 1/56]
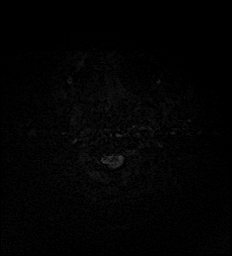
[im 28/56]
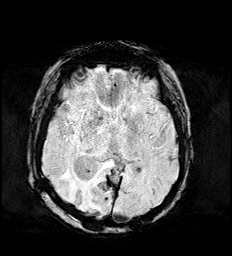
[im 56/56]
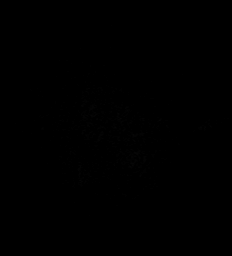

[Series 13: FLAIR · axial · 3.0mm · 0.53mm/px · z∈[-46,+115]mm · 3 of 55 slices shown]
[im 1/55]
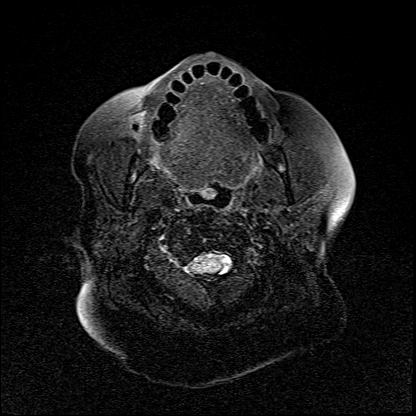
[im 28/55]
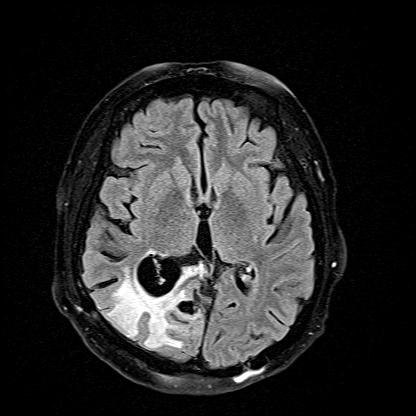
[im 55/55]
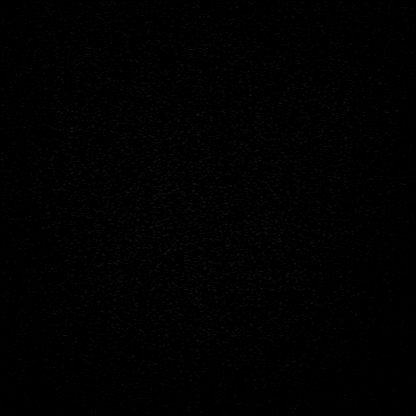

[Series 14: T1 · axial · 1.0mm · 0.98mm/px · z∈[-44,+114]mm · 8 of 160 slices shown (2 of 2)]
[im 1/160]
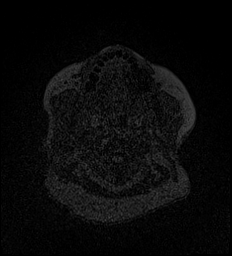
[im 20/160]
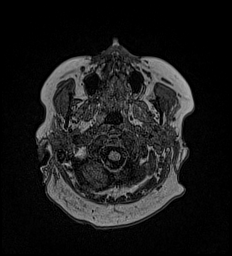
[im 40/160]
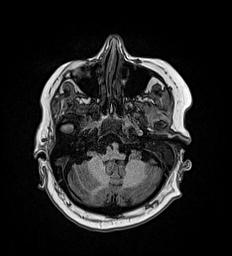
[im 60/160]
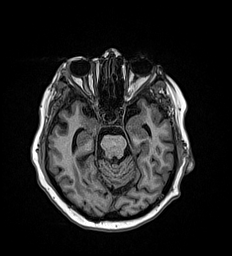
[im 100/160]
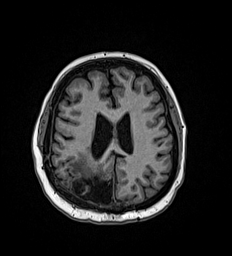
[im 120/160]
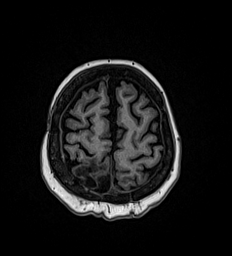
[im 140/160]
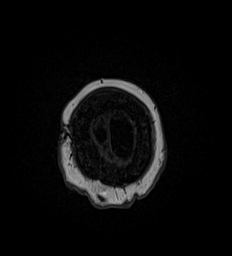
[im 160/160]
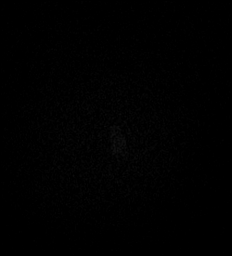

[Series 15: T2 post-contrast · coronal · 5.0mm · 0.57mm/px · 2 of 29 slices shown]
[im 1/29]
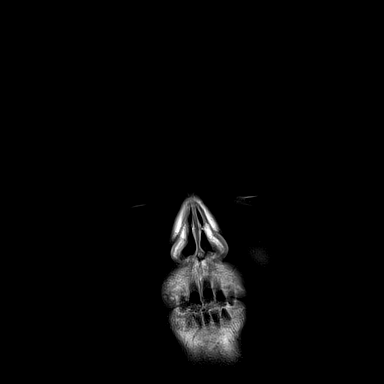
[im 29/29]
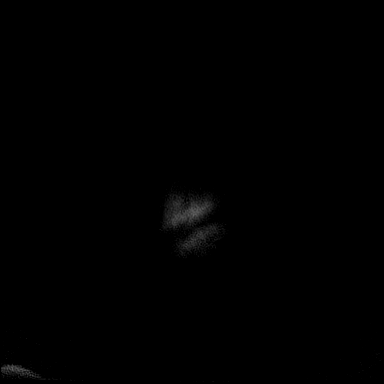

[Series 16: T1 post-contrast · axial · 1.0mm · 0.98mm/px · z∈[-39,+119]mm · 9 of 160 slices shown (1 of 3)]
[im 1/160]
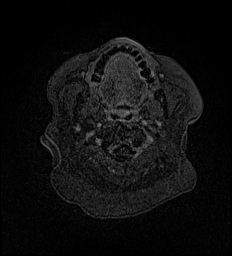
[im 20/160]
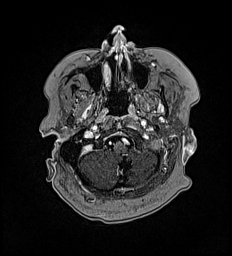
[im 40/160]
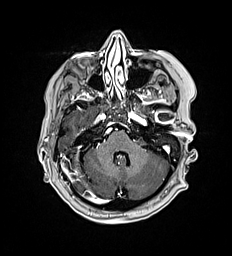
[im 60/160]
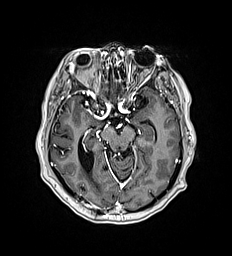
[im 80/160]
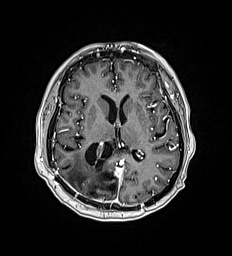
[im 100/160]
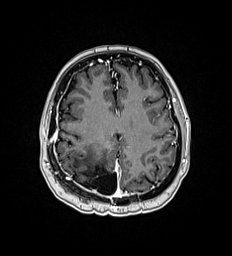
[im 120/160]
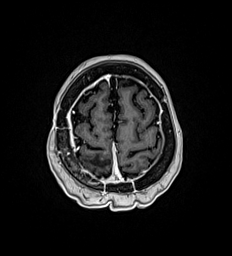
[im 140/160]
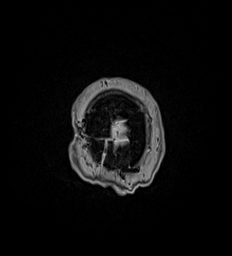
[im 160/160]
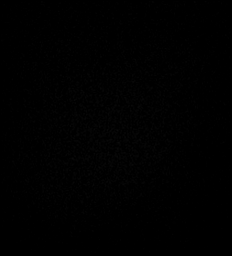

[Series 17: T1 post-contrast · coronal · 5.0mm · 0.57mm/px · 2 of 29 slices shown (2 of 3)]
[im 1/29]
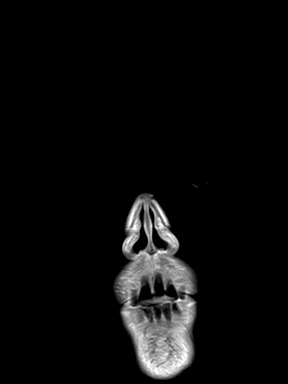
[im 29/29]
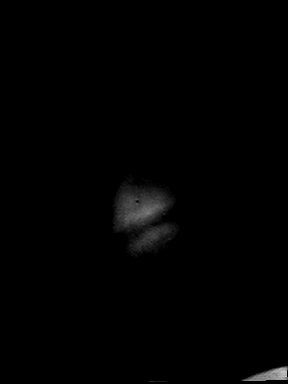

[Series 18: T1 post-contrast · sagittal · 5.0mm · 0.62mm/px · 1 of 25 slices shown (3 of 3)]
[im 1/25]
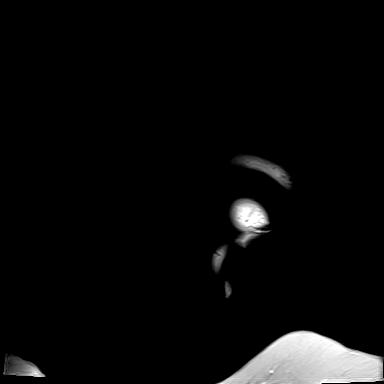

[44 of 48 positions shown; findings below may reference images not displayed]

FINDINGS: Brain: Interval resection of the large right parafalcine meningioma.
No rounded masslike residual is seen. There is dural thickening of
the posterior falx and deep to the bone flap. This may be in part
related to chronic obliteration of the superior sagittal sinus.

Extensive encephalomalacia centered on the right parietal lobe,
related to prior compression. There is volume loss suggesting that
FLAIR signal today is gliosis rather than edema.

No acute infarct, hydrocephalus, or collection.

Vascular: Superior sagittal sinus compromise on chronic basis at the
vertex.

Skull and upper cervical spine: C3-4 disc narrowing with ridging.
Spinal stenosis without definite cord impingement.

Unremarkable craniotomy site

Sinuses/Orbits: Left mastoid opacification, chronic based on prior.
Negative nasopharynx
IMPRESSION: Resected meningioma with no rounded masslike residual. There is
regional dural thickening attributed to postoperative changes and
chronic superior sagittal sinus compromise if stable on future
follow-ups.
